# Patient Record
Sex: Male | Born: 1965
Health system: Southern US, Community
[De-identification: ages and names within clinical notes are randomized; demographics above are authoritative.]

## PROBLEM LIST (undated history)

## (undated) DIAGNOSIS — R252 Cramp and spasm: Secondary | ICD-10-CM

## (undated) DIAGNOSIS — E039 Hypothyroidism, unspecified: Secondary | ICD-10-CM

## (undated) DIAGNOSIS — M9943 Connective tissue stenosis of neural canal of lumbar region: Secondary | ICD-10-CM

## (undated) DIAGNOSIS — K219 Gastro-esophageal reflux disease without esophagitis: Secondary | ICD-10-CM

## (undated) DIAGNOSIS — E611 Iron deficiency: Secondary | ICD-10-CM

## (undated) DIAGNOSIS — F909 Attention-deficit hyperactivity disorder, unspecified type: Secondary | ICD-10-CM

## (undated) DIAGNOSIS — G2581 Restless legs syndrome: Secondary | ICD-10-CM

## (undated) HISTORY — DX: Gastro-esophageal reflux disease without esophagitis: K21.9

## (undated) HISTORY — DX: Hypothyroidism, unspecified: E03.9

## (undated) HISTORY — DX: Connective tissue stenosis of neural canal of lumbar region: M99.43

---

## 2006-05-22 ENCOUNTER — Emergency Department: Payer: Self-pay | Admitting: Emergency Medicine

## 2007-01-19 ENCOUNTER — Emergency Department (HOSPITAL_COMMUNITY): Admission: EM | Admit: 2007-01-19 | Discharge: 2007-01-19 | Payer: Self-pay | Admitting: Emergency Medicine

## 2008-03-17 ENCOUNTER — Ambulatory Visit: Payer: Self-pay | Admitting: Family Medicine

## 2008-03-18 ENCOUNTER — Ambulatory Visit: Payer: Self-pay | Admitting: Family Medicine

## 2008-04-14 ENCOUNTER — Ambulatory Visit: Payer: Self-pay | Admitting: Family Medicine

## 2013-10-24 ENCOUNTER — Other Ambulatory Visit: Payer: Self-pay | Admitting: Sports Medicine

## 2013-10-24 DIAGNOSIS — M79669 Pain in unspecified lower leg: Secondary | ICD-10-CM

## 2013-10-29 ENCOUNTER — Ambulatory Visit
Admission: RE | Admit: 2013-10-29 | Discharge: 2013-10-29 | Disposition: A | Discharge status: Home / Self Care | Payer: BC Managed Care – PPO | Source: Ambulatory Visit | Attending: Sports Medicine | Admitting: Sports Medicine

## 2013-10-29 DIAGNOSIS — M79669 Pain in unspecified lower leg: Secondary | ICD-10-CM

## 2013-10-29 MED ORDER — IOHEXOL 180 MG/ML  SOLN
1.0000 mL | Freq: Once | INTRAMUSCULAR | Status: AC | PRN
  Administered 2013-10-29: 1 mL via EPIDURAL

## 2013-10-29 MED ORDER — METHYLPREDNISOLONE ACETATE 40 MG/ML INJ SUSP (RADIOLOG
120.0000 mg | Freq: Once | INTRAMUSCULAR | Status: AC
  Administered 2013-10-29: 120 mg via EPIDURAL

## 2013-10-29 NOTE — Discharge Instructions (Signed)

## 2013-11-22 ENCOUNTER — Other Ambulatory Visit: Payer: Self-pay | Admitting: Sports Medicine

## 2013-11-22 DIAGNOSIS — M79669 Pain in unspecified lower leg: Secondary | ICD-10-CM

## 2013-11-29 ENCOUNTER — Ambulatory Visit
Admission: RE | Admit: 2013-11-29 | Discharge: 2013-11-29 | Disposition: A | Discharge status: Home / Self Care | Payer: BC Managed Care – PPO | Source: Ambulatory Visit | Attending: Sports Medicine | Admitting: Sports Medicine

## 2013-11-29 VITALS — BP 121/81 | HR 62

## 2013-11-29 DIAGNOSIS — M79669 Pain in unspecified lower leg: Secondary | ICD-10-CM

## 2013-11-29 MED ORDER — IOHEXOL 180 MG/ML  SOLN
1.0000 mL | Freq: Once | INTRAMUSCULAR | Status: AC | PRN
  Administered 2013-11-29: 1 mL via EPIDURAL

## 2013-11-29 MED ORDER — METHYLPREDNISOLONE ACETATE 40 MG/ML INJ SUSP (RADIOLOG
120.0000 mg | Freq: Once | INTRAMUSCULAR | Status: AC
  Administered 2013-11-29: 120 mg via EPIDURAL

## 2014-03-17 DIAGNOSIS — M9943 Connective tissue stenosis of neural canal of lumbar region: Secondary | ICD-10-CM

## 2014-03-17 HISTORY — DX: Connective tissue stenosis of neural canal of lumbar region: M99.43

## 2015-06-23 DIAGNOSIS — F4323 Adjustment disorder with mixed anxiety and depressed mood: Secondary | ICD-10-CM | POA: Diagnosis not present

## 2015-06-30 DIAGNOSIS — F4323 Adjustment disorder with mixed anxiety and depressed mood: Secondary | ICD-10-CM | POA: Diagnosis not present

## 2015-07-07 DIAGNOSIS — F4323 Adjustment disorder with mixed anxiety and depressed mood: Secondary | ICD-10-CM | POA: Diagnosis not present

## 2015-07-27 DIAGNOSIS — F419 Anxiety disorder, unspecified: Secondary | ICD-10-CM | POA: Diagnosis not present

## 2015-08-07 DIAGNOSIS — F419 Anxiety disorder, unspecified: Secondary | ICD-10-CM | POA: Diagnosis not present

## 2015-08-12 DIAGNOSIS — F419 Anxiety disorder, unspecified: Secondary | ICD-10-CM | POA: Diagnosis not present

## 2015-08-20 DIAGNOSIS — F419 Anxiety disorder, unspecified: Secondary | ICD-10-CM | POA: Diagnosis not present

## 2015-08-21 DIAGNOSIS — M9902 Segmental and somatic dysfunction of thoracic region: Secondary | ICD-10-CM | POA: Diagnosis not present

## 2015-08-21 DIAGNOSIS — M9903 Segmental and somatic dysfunction of lumbar region: Secondary | ICD-10-CM | POA: Diagnosis not present

## 2015-08-21 DIAGNOSIS — M9904 Segmental and somatic dysfunction of sacral region: Secondary | ICD-10-CM | POA: Diagnosis not present

## 2015-08-21 DIAGNOSIS — M9905 Segmental and somatic dysfunction of pelvic region: Secondary | ICD-10-CM | POA: Diagnosis not present

## 2015-08-24 DIAGNOSIS — M9905 Segmental and somatic dysfunction of pelvic region: Secondary | ICD-10-CM | POA: Diagnosis not present

## 2015-08-24 DIAGNOSIS — M9902 Segmental and somatic dysfunction of thoracic region: Secondary | ICD-10-CM | POA: Diagnosis not present

## 2015-08-24 DIAGNOSIS — M9904 Segmental and somatic dysfunction of sacral region: Secondary | ICD-10-CM | POA: Diagnosis not present

## 2015-08-24 DIAGNOSIS — M9903 Segmental and somatic dysfunction of lumbar region: Secondary | ICD-10-CM | POA: Diagnosis not present

## 2015-08-31 DIAGNOSIS — M9902 Segmental and somatic dysfunction of thoracic region: Secondary | ICD-10-CM | POA: Diagnosis not present

## 2015-08-31 DIAGNOSIS — F419 Anxiety disorder, unspecified: Secondary | ICD-10-CM | POA: Diagnosis not present

## 2015-08-31 DIAGNOSIS — M9904 Segmental and somatic dysfunction of sacral region: Secondary | ICD-10-CM | POA: Diagnosis not present

## 2015-08-31 DIAGNOSIS — M9903 Segmental and somatic dysfunction of lumbar region: Secondary | ICD-10-CM | POA: Diagnosis not present

## 2015-08-31 DIAGNOSIS — M9905 Segmental and somatic dysfunction of pelvic region: Secondary | ICD-10-CM | POA: Diagnosis not present

## 2015-09-03 DIAGNOSIS — M9904 Segmental and somatic dysfunction of sacral region: Secondary | ICD-10-CM | POA: Diagnosis not present

## 2015-09-03 DIAGNOSIS — M9905 Segmental and somatic dysfunction of pelvic region: Secondary | ICD-10-CM | POA: Diagnosis not present

## 2015-09-03 DIAGNOSIS — M9903 Segmental and somatic dysfunction of lumbar region: Secondary | ICD-10-CM | POA: Diagnosis not present

## 2015-09-03 DIAGNOSIS — M9902 Segmental and somatic dysfunction of thoracic region: Secondary | ICD-10-CM | POA: Diagnosis not present

## 2015-09-08 DIAGNOSIS — M9903 Segmental and somatic dysfunction of lumbar region: Secondary | ICD-10-CM | POA: Diagnosis not present

## 2015-09-08 DIAGNOSIS — M9902 Segmental and somatic dysfunction of thoracic region: Secondary | ICD-10-CM | POA: Diagnosis not present

## 2015-09-08 DIAGNOSIS — M9905 Segmental and somatic dysfunction of pelvic region: Secondary | ICD-10-CM | POA: Diagnosis not present

## 2015-09-08 DIAGNOSIS — M9904 Segmental and somatic dysfunction of sacral region: Secondary | ICD-10-CM | POA: Diagnosis not present

## 2015-09-10 DIAGNOSIS — F419 Anxiety disorder, unspecified: Secondary | ICD-10-CM | POA: Diagnosis not present

## 2015-09-11 DIAGNOSIS — M9905 Segmental and somatic dysfunction of pelvic region: Secondary | ICD-10-CM | POA: Diagnosis not present

## 2015-09-11 DIAGNOSIS — M9902 Segmental and somatic dysfunction of thoracic region: Secondary | ICD-10-CM | POA: Diagnosis not present

## 2015-09-11 DIAGNOSIS — M9903 Segmental and somatic dysfunction of lumbar region: Secondary | ICD-10-CM | POA: Diagnosis not present

## 2015-09-11 DIAGNOSIS — M9904 Segmental and somatic dysfunction of sacral region: Secondary | ICD-10-CM | POA: Diagnosis not present

## 2015-09-14 DIAGNOSIS — M9905 Segmental and somatic dysfunction of pelvic region: Secondary | ICD-10-CM | POA: Diagnosis not present

## 2015-09-14 DIAGNOSIS — M9904 Segmental and somatic dysfunction of sacral region: Secondary | ICD-10-CM | POA: Diagnosis not present

## 2015-09-14 DIAGNOSIS — M9903 Segmental and somatic dysfunction of lumbar region: Secondary | ICD-10-CM | POA: Diagnosis not present

## 2015-09-14 DIAGNOSIS — M9902 Segmental and somatic dysfunction of thoracic region: Secondary | ICD-10-CM | POA: Diagnosis not present

## 2015-09-17 DIAGNOSIS — M9905 Segmental and somatic dysfunction of pelvic region: Secondary | ICD-10-CM | POA: Diagnosis not present

## 2015-09-17 DIAGNOSIS — M9903 Segmental and somatic dysfunction of lumbar region: Secondary | ICD-10-CM | POA: Diagnosis not present

## 2015-09-17 DIAGNOSIS — M9902 Segmental and somatic dysfunction of thoracic region: Secondary | ICD-10-CM | POA: Diagnosis not present

## 2015-09-17 DIAGNOSIS — M9904 Segmental and somatic dysfunction of sacral region: Secondary | ICD-10-CM | POA: Diagnosis not present

## 2015-09-23 DIAGNOSIS — F419 Anxiety disorder, unspecified: Secondary | ICD-10-CM | POA: Diagnosis not present

## 2015-09-25 DIAGNOSIS — M9905 Segmental and somatic dysfunction of pelvic region: Secondary | ICD-10-CM | POA: Diagnosis not present

## 2015-09-25 DIAGNOSIS — M9904 Segmental and somatic dysfunction of sacral region: Secondary | ICD-10-CM | POA: Diagnosis not present

## 2015-09-25 DIAGNOSIS — M9903 Segmental and somatic dysfunction of lumbar region: Secondary | ICD-10-CM | POA: Diagnosis not present

## 2015-09-25 DIAGNOSIS — M9902 Segmental and somatic dysfunction of thoracic region: Secondary | ICD-10-CM | POA: Diagnosis not present

## 2015-09-28 DIAGNOSIS — M9904 Segmental and somatic dysfunction of sacral region: Secondary | ICD-10-CM | POA: Diagnosis not present

## 2015-09-28 DIAGNOSIS — M9902 Segmental and somatic dysfunction of thoracic region: Secondary | ICD-10-CM | POA: Diagnosis not present

## 2015-09-28 DIAGNOSIS — M9905 Segmental and somatic dysfunction of pelvic region: Secondary | ICD-10-CM | POA: Diagnosis not present

## 2015-09-28 DIAGNOSIS — M9903 Segmental and somatic dysfunction of lumbar region: Secondary | ICD-10-CM | POA: Diagnosis not present

## 2015-10-01 DIAGNOSIS — F419 Anxiety disorder, unspecified: Secondary | ICD-10-CM | POA: Diagnosis not present

## 2015-10-01 DIAGNOSIS — M9905 Segmental and somatic dysfunction of pelvic region: Secondary | ICD-10-CM | POA: Diagnosis not present

## 2015-10-01 DIAGNOSIS — M9903 Segmental and somatic dysfunction of lumbar region: Secondary | ICD-10-CM | POA: Diagnosis not present

## 2015-10-01 DIAGNOSIS — M9902 Segmental and somatic dysfunction of thoracic region: Secondary | ICD-10-CM | POA: Diagnosis not present

## 2015-10-01 DIAGNOSIS — M9904 Segmental and somatic dysfunction of sacral region: Secondary | ICD-10-CM | POA: Diagnosis not present

## 2015-10-22 DIAGNOSIS — M9905 Segmental and somatic dysfunction of pelvic region: Secondary | ICD-10-CM | POA: Diagnosis not present

## 2015-10-22 DIAGNOSIS — M9902 Segmental and somatic dysfunction of thoracic region: Secondary | ICD-10-CM | POA: Diagnosis not present

## 2015-10-22 DIAGNOSIS — F419 Anxiety disorder, unspecified: Secondary | ICD-10-CM | POA: Diagnosis not present

## 2015-10-22 DIAGNOSIS — M9904 Segmental and somatic dysfunction of sacral region: Secondary | ICD-10-CM | POA: Diagnosis not present

## 2015-10-22 DIAGNOSIS — M9903 Segmental and somatic dysfunction of lumbar region: Secondary | ICD-10-CM | POA: Diagnosis not present

## 2015-10-26 DIAGNOSIS — M9902 Segmental and somatic dysfunction of thoracic region: Secondary | ICD-10-CM | POA: Diagnosis not present

## 2015-10-26 DIAGNOSIS — M9904 Segmental and somatic dysfunction of sacral region: Secondary | ICD-10-CM | POA: Diagnosis not present

## 2015-10-26 DIAGNOSIS — M9903 Segmental and somatic dysfunction of lumbar region: Secondary | ICD-10-CM | POA: Diagnosis not present

## 2015-10-26 DIAGNOSIS — M9905 Segmental and somatic dysfunction of pelvic region: Secondary | ICD-10-CM | POA: Diagnosis not present

## 2015-10-28 DIAGNOSIS — G4733 Obstructive sleep apnea (adult) (pediatric): Secondary | ICD-10-CM | POA: Diagnosis not present

## 2015-10-30 DIAGNOSIS — M9902 Segmental and somatic dysfunction of thoracic region: Secondary | ICD-10-CM | POA: Diagnosis not present

## 2015-10-30 DIAGNOSIS — M9903 Segmental and somatic dysfunction of lumbar region: Secondary | ICD-10-CM | POA: Diagnosis not present

## 2015-10-30 DIAGNOSIS — M9905 Segmental and somatic dysfunction of pelvic region: Secondary | ICD-10-CM | POA: Diagnosis not present

## 2015-10-30 DIAGNOSIS — M9904 Segmental and somatic dysfunction of sacral region: Secondary | ICD-10-CM | POA: Diagnosis not present

## 2015-11-03 DIAGNOSIS — M9902 Segmental and somatic dysfunction of thoracic region: Secondary | ICD-10-CM | POA: Diagnosis not present

## 2015-11-03 DIAGNOSIS — M9905 Segmental and somatic dysfunction of pelvic region: Secondary | ICD-10-CM | POA: Diagnosis not present

## 2015-11-03 DIAGNOSIS — M9903 Segmental and somatic dysfunction of lumbar region: Secondary | ICD-10-CM | POA: Diagnosis not present

## 2015-11-03 DIAGNOSIS — M9904 Segmental and somatic dysfunction of sacral region: Secondary | ICD-10-CM | POA: Diagnosis not present

## 2015-11-06 DIAGNOSIS — M9902 Segmental and somatic dysfunction of thoracic region: Secondary | ICD-10-CM | POA: Diagnosis not present

## 2015-11-06 DIAGNOSIS — M9903 Segmental and somatic dysfunction of lumbar region: Secondary | ICD-10-CM | POA: Diagnosis not present

## 2015-11-06 DIAGNOSIS — M9904 Segmental and somatic dysfunction of sacral region: Secondary | ICD-10-CM | POA: Diagnosis not present

## 2015-11-06 DIAGNOSIS — M9905 Segmental and somatic dysfunction of pelvic region: Secondary | ICD-10-CM | POA: Diagnosis not present

## 2015-11-10 DIAGNOSIS — L723 Sebaceous cyst: Secondary | ICD-10-CM | POA: Diagnosis not present

## 2015-11-11 DIAGNOSIS — M9905 Segmental and somatic dysfunction of pelvic region: Secondary | ICD-10-CM | POA: Diagnosis not present

## 2015-11-11 DIAGNOSIS — M9904 Segmental and somatic dysfunction of sacral region: Secondary | ICD-10-CM | POA: Diagnosis not present

## 2015-11-11 DIAGNOSIS — M9902 Segmental and somatic dysfunction of thoracic region: Secondary | ICD-10-CM | POA: Diagnosis not present

## 2015-11-11 DIAGNOSIS — M9903 Segmental and somatic dysfunction of lumbar region: Secondary | ICD-10-CM | POA: Diagnosis not present

## 2015-11-18 DIAGNOSIS — M9902 Segmental and somatic dysfunction of thoracic region: Secondary | ICD-10-CM | POA: Diagnosis not present

## 2015-11-18 DIAGNOSIS — M9905 Segmental and somatic dysfunction of pelvic region: Secondary | ICD-10-CM | POA: Diagnosis not present

## 2015-11-18 DIAGNOSIS — M9904 Segmental and somatic dysfunction of sacral region: Secondary | ICD-10-CM | POA: Diagnosis not present

## 2015-11-18 DIAGNOSIS — M9903 Segmental and somatic dysfunction of lumbar region: Secondary | ICD-10-CM | POA: Diagnosis not present

## 2015-11-25 DIAGNOSIS — M9903 Segmental and somatic dysfunction of lumbar region: Secondary | ICD-10-CM | POA: Diagnosis not present

## 2015-11-25 DIAGNOSIS — M9904 Segmental and somatic dysfunction of sacral region: Secondary | ICD-10-CM | POA: Diagnosis not present

## 2015-11-25 DIAGNOSIS — M9905 Segmental and somatic dysfunction of pelvic region: Secondary | ICD-10-CM | POA: Diagnosis not present

## 2015-11-25 DIAGNOSIS — M9902 Segmental and somatic dysfunction of thoracic region: Secondary | ICD-10-CM | POA: Diagnosis not present

## 2015-11-30 DIAGNOSIS — M9903 Segmental and somatic dysfunction of lumbar region: Secondary | ICD-10-CM | POA: Diagnosis not present

## 2015-11-30 DIAGNOSIS — M955 Acquired deformity of pelvis: Secondary | ICD-10-CM | POA: Diagnosis not present

## 2015-11-30 DIAGNOSIS — M9905 Segmental and somatic dysfunction of pelvic region: Secondary | ICD-10-CM | POA: Diagnosis not present

## 2015-11-30 DIAGNOSIS — M5416 Radiculopathy, lumbar region: Secondary | ICD-10-CM | POA: Diagnosis not present

## 2016-01-27 DIAGNOSIS — K219 Gastro-esophageal reflux disease without esophagitis: Secondary | ICD-10-CM

## 2016-01-27 DIAGNOSIS — R5383 Other fatigue: Secondary | ICD-10-CM | POA: Diagnosis not present

## 2016-01-27 HISTORY — DX: Gastro-esophageal reflux disease without esophagitis: K21.9

## 2016-03-04 ENCOUNTER — Encounter: Payer: Self-pay | Admitting: Family Medicine

## 2016-03-04 ENCOUNTER — Ambulatory Visit (INDEPENDENT_AMBULATORY_CARE_PROVIDER_SITE_OTHER): Payer: BLUE CROSS/BLUE SHIELD | Admitting: Family Medicine

## 2016-03-04 VITALS — BP 139/82 | HR 64 | Ht 71.0 in | Wt 246.0 lb

## 2016-03-04 DIAGNOSIS — E039 Hypothyroidism, unspecified: Secondary | ICD-10-CM | POA: Diagnosis not present

## 2016-03-04 DIAGNOSIS — M9943 Connective tissue stenosis of neural canal of lumbar region: Secondary | ICD-10-CM | POA: Diagnosis not present

## 2016-03-04 DIAGNOSIS — K219 Gastro-esophageal reflux disease without esophagitis: Secondary | ICD-10-CM

## 2016-03-04 DIAGNOSIS — R252 Cramp and spasm: Secondary | ICD-10-CM

## 2016-03-04 HISTORY — DX: Hypothyroidism, unspecified: E03.9

## 2016-03-04 MED ORDER — BACLOFEN 10 MG PO TABS: 5.0000 mg | ORAL_TABLET | Freq: Every evening | ORAL | 2 refills | Status: DC | PRN

## 2016-03-04 NOTE — Patient Instructions (Signed)
Thank you for coming in today. Add baclofen.  Do the Eccentric calf exercises daily.  Let me know how you are doing.    Muscle Cramps and Spasms Muscle cramps and spasms occur when a muscle or muscles tighten and you have no control over this tightening (involuntary muscle contraction). They are a common problem and can develop in any muscle. The most common place is in the calf muscles of the leg. Both muscle cramps and muscle spasms are involuntary muscle contractions, but they also have differences:   Muscle cramps are sporadic and painful. They may last a few seconds to a quarter of an hour. Muscle cramps are often more forceful and last longer than muscle spasms.  Muscle spasms may or may not be painful. They may also last just a few seconds or much longer. CAUSES  It is uncommon for cramps or spasms to be due to a serious underlying problem. In many cases, the cause of cramps or spasms is unknown. Some common causes are:   Overexertion.   Overuse from repetitive motions (doing the same thing over and over).   Remaining in a certain position for a long period of time.   Improper preparation, form, or technique while performing a sport or activity.   Dehydration.   Injury.   Side effects of some medicines.   Abnormally low levels of the salts and ions in your blood (electrolytes), especially potassium and calcium. This could happen if you are taking water pills (diuretics) or you are pregnant.  Some underlying medical problems can make it more likely to develop cramps or spasms. These include, but are not limited to:   Diabetes.   Parkinson disease.   Hormone disorders, such as thyroid problems.   Alcohol abuse.   Diseases specific to muscles, joints, and bones.   Blood vessel disease where not enough blood is getting to the muscles.  HOME CARE INSTRUCTIONS   Stay well hydrated. Drink enough water and fluids to keep your urine clear or pale yellow.  It  may be helpful to massage, stretch, and relax the affected muscle.  For tight or tense muscles, use a warm towel, heating pad, or hot shower water directed to the affected area.  If you are sore or have pain after a cramp or spasm, applying ice to the affected area may relieve discomfort.  Put ice in a plastic bag.  Place a towel between your skin and the bag.  Leave the ice on for 15-20 minutes, 3-4 times a day.  Medicines used to treat a known cause of cramps or spasms may help reduce their frequency or severity. Only take over-the-counter or prescription medicines as directed by your caregiver. SEEK MEDICAL CARE IF:  Your cramps or spasms get more severe, more frequent, or do not improve over time.  MAKE SURE YOU:   Understand these instructions.  Will watch your condition.  Will get help right away if you are not doing well or get worse. This information is not intended to replace advice given to you by your health care provider. Make sure you discuss any questions you have with your health care provider. Document Released: 08/27/2001 Document Revised: 07/02/2012 Document Reviewed: 12/09/2014 Elsevier Interactive Patient Education  2017 Elsevier Inc.   Baclofen tablets What is this medicine? BACLOFEN (BAK loe fen) helps relieve spasms and cramping of muscles. It may be used to treat symptoms of multiple sclerosis or spinal cord injury. This medicine may be used for other purposes; ask  your health care provider or pharmacist if you have questions. COMMON BRAND NAME(S): ED Baclofen, Lioresal What should I tell my health care provider before I take this medicine? They need to know if you have any of these conditions: -kidney disease -seizures -stroke -an unusual or allergic reaction to baclofen, other medicines, foods, dyes, or preservatives -pregnant or trying to get pregnant -breast-feeding How should I use this medicine? Take this medicine by mouth. Swallow it with a  drink of water. Follow the directions on the prescription label. Do not take more medicine than you are told to take. Talk to your pediatrician regarding the use of this medicine in children. Special care may be needed. Overdosage: If you think you have taken too much of this medicine contact a poison control center or emergency room at once. NOTE: This medicine is only for you. Do not share this medicine with others. What if I miss a dose? If you miss a dose, take it as soon as you can. If it is almost time for your next dose, take only that dose. Do not take double or extra doses. What may interact with this medicine? Do not take this medication with any of the following medicines: -narcotic medicines for cough This medicine may also interact with the following medications: -alcohol -antihistamines for allergy, cough and cold -certain medicines for anxiety or sleep -certain medicines for depression like amitriptyline, fluoxetine, sertraline -certain medicines for seizures like phenobarbital, primidone -general anesthetics like halothane, isoflurane, methoxyflurane, propofol -local anesthetics like lidocaine, pramoxine, tetracaine -medicines that relax muscles for surgery -narcotic medicines for pain -phenothiazines like chlorpromazine, mesoridazine, prochlorperazine, thioridazine This list may not describe all possible interactions. Give your health care provider a list of all the medicines, herbs, non-prescription drugs, or dietary supplements you use. Also tell them if you smoke, drink alcohol, or use illegal drugs. Some items may interact with your medicine. What should I watch for while using this medicine? Tell your doctor or health care professional if your symptoms do not start to get better or if they get worse. Do not suddenly stop taking your medicine. If you do, you may develop a severe reaction. If your doctor wants you to stop the medicine, the dose will be slowly lowered over  time to avoid any side effects. Follow the advice of your doctor. You may get drowsy or dizzy. Do not drive, use machinery, or do anything that needs mental alertness until you know how this medicine affects you. Do not stand or sit up quickly, especially if you are an older patient. This reduces the risk of dizzy or fainting spells. Alcohol may interfere with the effect of this medicine. Avoid alcoholic drinks. If you are taking another medicine that also causes drowsiness, you may have more side effects. Give your health care provider a list of all medicines you use. Your doctor will tell you how much medicine to take. Do not take more medicine than directed. Call emergency for help if you have problems breathing or unusual sleepiness. What side effects may I notice from receiving this medicine? Side effects that you should report to your doctor or health care professional as soon as possible: -allergic reactions like skin rash, itching or hives, swelling of the face, lips, or tongue -breathing problems -changes in emotions or moods -changes in vision -chest pain -fast, irregular heartbeat -feeling faint or lightheaded, falls -hallucinations -loss of balance or coordination -ringing of the ears -seizures -trouble passing urine or change in the amount of  urine -trouble walking -unusually weak or tired Side effects that usually do not require medical attention (report to your doctor or health care professional if they continue or are bothersome): -changes in taste -confusion -constipation -diarrhea -dry mouth -headache -muscle weakness -nausea, vomiting -trouble sleeping This list may not describe all possible side effects. Call your doctor for medical advice about side effects. You may report side effects to FDA at 1-800-FDA-1088. Where should I keep my medicine? Keep out of the reach of children. Store at room temperature between 15 and 30 degrees C (59 and 86 degrees F). Keep  container tightly closed. Throw away any unused medicine after the expiration date. NOTE: This sheet is a summary. It may not cover all possible information. If you have questions about this medicine, talk to your doctor, pharmacist, or health care provider.  2017 Elsevier/Gold Standard (2014-12-15 15:56:23)

## 2016-03-04 NOTE — Progress Notes (Signed)
Jay BradleyStephen Schneider Jay MccallumBloch Schulman is a 50 y.o. male who presents to [facility] today for discuss calf cramping.  Patient has a several year history of bilateral calf cramping. The cramping typically occurs at night a day or 2 following exertion. It is very painful and awakens him from sleep. He notes the Pain is limiting his ability to exercise. He denies any pain or limitations during the exercise and self but has bothersome cramping a day or 2 later.  He has had a limited workup including a lumbar MRI showing lumbar spinal stenosis worst at L4-L5. He's had 2 epidural steroid injections in 2015 and it made no difference. He denies any radiating pain weakness or numbness into his legs. She only about a month and a half ago he was diagnosed with mild hypothyroidism with a TSH of 5.8. He's been taking levothyroxine 50 g daily now for about a month and a half. He's not sure if there is been much difference with the new levothyroxine. He denies any bowel or bladder dysfunction fevers or chills nausea vomiting or diarrhea.   Past Medical History:  Diagnosis Date  . Connective tissue stenosis of neural canal of lumbar region 03/17/2014  . Gastroesophageal reflux disease without esophagitis 01/27/2016  . Hypothyroidism 03/04/2016   No past surgical history on file. Social History  Substance Use Topics  . Smoking status: Not on file  . Smokeless tobacco: Not on file  . Alcohol use Not on file   family history is not on file.  ROS:  No headache, visual changes, nausea, vomiting, diarrhea, constipation, dizziness, abdominal pain, skin rash, fevers, chills, night sweats, weight loss, swollen lymph nodes, body aches, joint swelling, muscle aches, chest pain, shortness of breath, mood changes, visual or auditory hallucinations.    Medications: Current Outpatient Prescriptions  Medication Sig Dispense Refill  . levothyroxine (SYNTHROID, LEVOTHROID) 50 MCG  tablet Take by mouth.    . baclofen (LIORESAL) 10 MG tablet Take 0.5-1 tablets (5-10 mg total) by mouth at bedtime as needed for muscle spasms. 30 each 2   No current facility-administered medications for this visit.    No Known Allergies   Exam:  BP 139/82   Pulse 64   Ht 5\' 11"  (1.803 m)   Wt 246 lb (111.6 kg)   BMI 34.31 kg/m  General: Well Developed, well nourished, and in no acute distress.  Neuro/Psych: Alert and oriented x3, extra-ocular muscles intact, able to move all 4 extremities, sensation grossly intact. Skin: Warm and dry, no rashes noted.  Respiratory: Not using accessory muscles, speaking in full sentences, trachea midline.  Cardiovascular: Pulses palpable, no extremity edema. Abdomen: Does not appear distended. MSK:  Lumbar spine is normal appearing and nontender. He has limited lumbar flexion but intact extension rotation range of motion.  Lower extremity sensation is equal and normal throughout.  Calves are normal-appearing bilaterally and are nontender. He has normal calf motion. He has intact pulses capillary refill and sensation to his feet bilaterally.  Left foot has a moderate bunion with mild ankle pronation but normal and symmetrical heel valgus with toe standing.  Review of laboratories workup from [facility] [place] November 2017 showed normal metabolic panel CBC CK and sedimentation rate. TSH is mildly elevated as noted above.    No results found for this or any previous visit (from the past 48 hour(s)). No results found.    Assessment and Plan: 50 y.o. male with  Calf cramping. This appears to  be idiopathic at this time. There are several possibilities. He may have easy susceptibility to cramping with overexertion with deconditioning probable mild hypothyroidism and possible spinal stenosis. I think the spinal stenosis is less likely of a cause.   We discussed several options. Plan for trial of baclofen as needed at night while continuing to  titrate levothyroxine dose as guided by his primary care provider. Additionally will work on calf eccentric exercises to help strengthen muscles and increase flexibility. This is been shown to reduce delayed onset muscle soreness with exercise which may be a component of his cramping. Additionally recommend a post exercise nutrition regimen of one cup of chocolate milk as this also has been shown to reduce delayed onset muscle soreness following exercise.   If symptoms persist workup could include a nerve conduction study further rheumatologic workup and further vascular workup. Return as needed.     No orders of the defined types were placed in this encounter.   Discussed warning signs or symptoms. Please see discharge instructions. Patient expresses understanding.  CC:  Dionisio PaschalBronstein, David Marvin, MD  [phone]  [place], [place] [id number]  [phone]  [phone] (Fax)

## 2016-03-28 DIAGNOSIS — R946 Abnormal results of thyroid function studies: Secondary | ICD-10-CM | POA: Diagnosis not present

## 2016-04-01 DIAGNOSIS — E039 Hypothyroidism, unspecified: Secondary | ICD-10-CM | POA: Diagnosis not present

## 2016-05-01 DIAGNOSIS — J018 Other acute sinusitis: Secondary | ICD-10-CM | POA: Diagnosis not present

## 2016-05-25 DIAGNOSIS — E039 Hypothyroidism, unspecified: Secondary | ICD-10-CM | POA: Diagnosis not present

## 2016-07-25 ENCOUNTER — Other Ambulatory Visit: Payer: Self-pay | Admitting: Family Medicine

## 2016-08-02 ENCOUNTER — Telehealth: Payer: Self-pay | Admitting: Family Medicine

## 2016-08-02 MED ORDER — BACLOFEN 10 MG PO TABS: 10.0000 mg | ORAL_TABLET | Freq: Every day | ORAL | 2 refills | Status: DC

## 2016-08-02 NOTE — Telephone Encounter (Signed)
Discussed having some spasm at night after a hard workout.  Plan to try 20mg  as needed.

## 2016-08-08 DIAGNOSIS — Z23 Encounter for immunization: Secondary | ICD-10-CM | POA: Diagnosis not present

## 2016-08-30 DIAGNOSIS — F419 Anxiety disorder, unspecified: Secondary | ICD-10-CM | POA: Diagnosis not present

## 2016-09-09 DIAGNOSIS — F419 Anxiety disorder, unspecified: Secondary | ICD-10-CM | POA: Diagnosis not present

## 2016-09-09 DIAGNOSIS — Z630 Problems in relationship with spouse or partner: Secondary | ICD-10-CM | POA: Diagnosis not present

## 2016-09-16 DIAGNOSIS — F419 Anxiety disorder, unspecified: Secondary | ICD-10-CM | POA: Diagnosis not present

## 2016-09-16 DIAGNOSIS — Z630 Problems in relationship with spouse or partner: Secondary | ICD-10-CM | POA: Diagnosis not present

## 2016-09-19 ENCOUNTER — Telehealth: Payer: Self-pay | Admitting: Family Medicine

## 2016-09-19 NOTE — Telephone Encounter (Signed)
Patient called adv that he spoke with you outside of the office and you were going to give him a C-pap Titration test pt request to know if you can order one please adv [phone]

## 2016-09-20 MED ORDER — AMBULATORY NON FORMULARY MEDICATION: 0 refills | Status: DC

## 2016-09-20 NOTE — Telephone Encounter (Signed)
Order written. We will need to contact pt's CPAP supplier and arrange for this.

## 2016-09-20 NOTE — Telephone Encounter (Signed)
Faxed

## 2016-09-28 DIAGNOSIS — F419 Anxiety disorder, unspecified: Secondary | ICD-10-CM | POA: Diagnosis not present

## 2016-09-28 DIAGNOSIS — Z630 Problems in relationship with spouse or partner: Secondary | ICD-10-CM | POA: Diagnosis not present

## 2016-09-30 ENCOUNTER — Telehealth: Payer: Self-pay | Admitting: Family Medicine

## 2016-09-30 MED ORDER — AMBULATORY NON FORMULARY MEDICATION: 0 refills | Status: DC

## 2016-09-30 NOTE — Telephone Encounter (Signed)
1 

## 2016-09-30 NOTE — Telephone Encounter (Signed)
CPAP/OSA:  Jay Schneider benefits from his CPAP and uses it daily. However is noted previously he is having more fatigue and thinks perhaps the current settings are an appropriate. I have performed a physical exam at his house and notes that he has increased neck mass and a normal-appearing posterior pharynx. His heart and lung sounds are normal. He has a normal heart rate.  Plan to get a new CPAP machine that'll allow auto titration trial.

## 2016-10-07 DIAGNOSIS — Z630 Problems in relationship with spouse or partner: Secondary | ICD-10-CM | POA: Diagnosis not present

## 2016-10-07 DIAGNOSIS — F419 Anxiety disorder, unspecified: Secondary | ICD-10-CM | POA: Diagnosis not present

## 2016-10-11 DIAGNOSIS — G4733 Obstructive sleep apnea (adult) (pediatric): Secondary | ICD-10-CM | POA: Diagnosis not present

## 2016-10-11 DIAGNOSIS — Z630 Problems in relationship with spouse or partner: Secondary | ICD-10-CM | POA: Diagnosis not present

## 2016-10-11 DIAGNOSIS — F419 Anxiety disorder, unspecified: Secondary | ICD-10-CM | POA: Diagnosis not present

## 2016-10-14 DIAGNOSIS — H66001 Acute suppurative otitis media without spontaneous rupture of ear drum, right ear: Secondary | ICD-10-CM | POA: Diagnosis not present

## 2016-10-14 DIAGNOSIS — H6122 Impacted cerumen, left ear: Secondary | ICD-10-CM | POA: Diagnosis not present

## 2016-10-14 DIAGNOSIS — H6502 Acute serous otitis media, left ear: Secondary | ICD-10-CM | POA: Diagnosis not present

## 2016-10-21 DIAGNOSIS — F419 Anxiety disorder, unspecified: Secondary | ICD-10-CM | POA: Diagnosis not present

## 2016-10-21 DIAGNOSIS — Z630 Problems in relationship with spouse or partner: Secondary | ICD-10-CM | POA: Diagnosis not present

## 2016-10-28 DIAGNOSIS — Z630 Problems in relationship with spouse or partner: Secondary | ICD-10-CM | POA: Diagnosis not present

## 2016-10-28 DIAGNOSIS — F419 Anxiety disorder, unspecified: Secondary | ICD-10-CM | POA: Diagnosis not present

## 2016-11-01 DIAGNOSIS — F418 Other specified anxiety disorders: Secondary | ICD-10-CM | POA: Diagnosis not present

## 2016-11-02 DIAGNOSIS — F419 Anxiety disorder, unspecified: Secondary | ICD-10-CM | POA: Diagnosis not present

## 2016-11-02 DIAGNOSIS — Z630 Problems in relationship with spouse or partner: Secondary | ICD-10-CM | POA: Diagnosis not present

## 2016-11-09 DIAGNOSIS — F418 Other specified anxiety disorders: Secondary | ICD-10-CM | POA: Diagnosis not present

## 2016-11-11 DIAGNOSIS — G4733 Obstructive sleep apnea (adult) (pediatric): Secondary | ICD-10-CM | POA: Diagnosis not present

## 2016-11-11 DIAGNOSIS — Z630 Problems in relationship with spouse or partner: Secondary | ICD-10-CM | POA: Diagnosis not present

## 2016-11-11 DIAGNOSIS — F419 Anxiety disorder, unspecified: Secondary | ICD-10-CM | POA: Diagnosis not present

## 2016-11-15 DIAGNOSIS — F418 Other specified anxiety disorders: Secondary | ICD-10-CM | POA: Diagnosis not present

## 2016-11-18 DIAGNOSIS — F419 Anxiety disorder, unspecified: Secondary | ICD-10-CM | POA: Diagnosis not present

## 2016-11-18 DIAGNOSIS — Z630 Problems in relationship with spouse or partner: Secondary | ICD-10-CM | POA: Diagnosis not present

## 2016-11-25 DIAGNOSIS — Z630 Problems in relationship with spouse or partner: Secondary | ICD-10-CM | POA: Diagnosis not present

## 2016-11-25 DIAGNOSIS — F419 Anxiety disorder, unspecified: Secondary | ICD-10-CM | POA: Diagnosis not present

## 2016-11-28 DIAGNOSIS — F418 Other specified anxiety disorders: Secondary | ICD-10-CM | POA: Diagnosis not present

## 2016-11-29 DIAGNOSIS — Z630 Problems in relationship with spouse or partner: Secondary | ICD-10-CM | POA: Diagnosis not present

## 2016-11-29 DIAGNOSIS — F419 Anxiety disorder, unspecified: Secondary | ICD-10-CM | POA: Diagnosis not present

## 2016-12-01 DIAGNOSIS — L609 Nail disorder, unspecified: Secondary | ICD-10-CM | POA: Diagnosis not present

## 2016-12-01 DIAGNOSIS — B351 Tinea unguium: Secondary | ICD-10-CM | POA: Diagnosis not present

## 2016-12-01 DIAGNOSIS — Z79899 Other long term (current) drug therapy: Secondary | ICD-10-CM | POA: Diagnosis not present

## 2016-12-05 ENCOUNTER — Telehealth: Payer: Self-pay | Admitting: Family Medicine

## 2016-12-05 NOTE — Telephone Encounter (Signed)
Jay Schneider notes pain at the right 5th MCP for about 2 weeks.  He participates in tae kwon do and felt pain after he punched a board. He feels popping and motion at the fifth MCP is seen with pain and swelling. He is concerned he may have fractured it.  Exam: Swollen and mildly tender fifth MCP. No rotational deformity. Motion is intact capillary refill and sensation is intact distally.  Limited musculoskeletal ultrasound shows a defect of the cortex of the bone just proximal to the fifth MCP consistent with fracture.  Patient was placed into an ulnar gutter splint and will return in 2 weeks for x-ray

## 2016-12-09 DIAGNOSIS — Z630 Problems in relationship with spouse or partner: Secondary | ICD-10-CM | POA: Diagnosis not present

## 2016-12-09 DIAGNOSIS — F419 Anxiety disorder, unspecified: Secondary | ICD-10-CM | POA: Diagnosis not present

## 2016-12-12 DIAGNOSIS — G4733 Obstructive sleep apnea (adult) (pediatric): Secondary | ICD-10-CM | POA: Diagnosis not present

## 2016-12-15 DIAGNOSIS — F418 Other specified anxiety disorders: Secondary | ICD-10-CM | POA: Diagnosis not present

## 2016-12-16 ENCOUNTER — Ambulatory Visit (INDEPENDENT_AMBULATORY_CARE_PROVIDER_SITE_OTHER): Payer: BLUE CROSS/BLUE SHIELD | Admitting: Family Medicine

## 2016-12-16 ENCOUNTER — Ambulatory Visit (INDEPENDENT_AMBULATORY_CARE_PROVIDER_SITE_OTHER): Payer: BLUE CROSS/BLUE SHIELD

## 2016-12-16 VITALS — HR 70

## 2016-12-16 DIAGNOSIS — R252 Cramp and spasm: Secondary | ICD-10-CM | POA: Diagnosis not present

## 2016-12-16 DIAGNOSIS — M25541 Pain in joints of right hand: Secondary | ICD-10-CM

## 2016-12-16 DIAGNOSIS — R293 Abnormal posture: Secondary | ICD-10-CM

## 2016-12-16 DIAGNOSIS — M9943 Connective tissue stenosis of neural canal of lumbar region: Secondary | ICD-10-CM | POA: Diagnosis not present

## 2016-12-16 DIAGNOSIS — M659 Synovitis and tenosynovitis, unspecified: Secondary | ICD-10-CM | POA: Diagnosis not present

## 2016-12-16 DIAGNOSIS — S6991XA Unspecified injury of right wrist, hand and finger(s), initial encounter: Secondary | ICD-10-CM | POA: Diagnosis not present

## 2016-12-16 DIAGNOSIS — M79641 Pain in right hand: Secondary | ICD-10-CM | POA: Diagnosis not present

## 2016-12-16 DIAGNOSIS — F419 Anxiety disorder, unspecified: Secondary | ICD-10-CM | POA: Diagnosis not present

## 2016-12-16 DIAGNOSIS — S62339A Displaced fracture of neck of unspecified metacarpal bone, initial encounter for closed fracture: Secondary | ICD-10-CM

## 2016-12-16 DIAGNOSIS — Z630 Problems in relationship with spouse or partner: Secondary | ICD-10-CM | POA: Diagnosis not present

## 2016-12-16 DIAGNOSIS — M65949 Unspecified synovitis and tenosynovitis, unspecified hand: Secondary | ICD-10-CM | POA: Insufficient documentation

## 2016-12-16 MED ORDER — DICLOFENAC SODIUM 1 % TD GEL: 2.0000 g | Freq: Four times a day (QID) | TRANSDERMAL | 11 refills | Status: DC

## 2016-12-16 NOTE — Patient Instructions (Signed)
Thank you for coming in today. We are waiting on xray results from radiology to confirm no fracture.  Apply voltaren (diclofenac) gel 4x daily for pain and swelling.  Continue buddy tape.  We will recheck at home in about 2 weeks or so.  If not getting better hand PT is the next option.   I think the diagnosis is traumatic synovitis.

## 2016-12-16 NOTE — Progress Notes (Signed)
Jay Schneider is a 51 y.o. male who presents to [facility] today for right hand pain. Patient notes pain in the right fifth MCP. He suffered an injury participating in tae kwon do where he punched a board. This occurred about 3-4 weeks ago. He developed pain and swelling in this area and was thought to potentially have a boxer's fracture. He was treated conservatively with an ulnar gutter splint and is here today for follow-up. He notes some continued swelling but overall improved pain.   Additionally patient has poor posture overall. He has some mild back pain as well. He's interested in a physical therapy referral to help deal with poor posture and to improve his back pain.  Past Medical History:  Diagnosis Date  . Connective tissue stenosis of neural canal of lumbar region 03/17/2014  . Gastroesophageal reflux disease without esophagitis 01/27/2016  . Hypothyroidism 03/04/2016   No past surgical history on file. Social History  Substance Use Topics  . Smoking status: Not on file  . Smokeless tobacco: Not on file  . Alcohol use Not on file     ROS:  As above   Medications: Current Outpatient Prescriptions  Medication Sig Dispense Refill  . AMBULATORY NON FORMULARY MEDICATION Continuous positive airway pressure (CPAP) machine auto-titrate from 4-20 cm of H2O pressure, with all supplemental supplies as needed. Please fax 7 day data to [phone] 1 each 0  . baclofen (LIORESAL) 10 MG tablet Take 1-2 tablets (10-20 mg total) by mouth at bedtime. 90 tablet 2  . diclofenac sodium (VOLTAREN) 1 % GEL Apply 2 g topically 4 (four) times daily. To affected joint. 100 g 11  . levothyroxine (SYNTHROID, LEVOTHROID) 50 MCG tablet Take by mouth.     No current facility-administered medications for this visit.    No Known Allergies   Exam:  Pulse 70   General: Well Developed, well nourished, and in no acute distress.    Neuro/Psych: Alert and oriented x3, extra-ocular muscles intact, able to move all 4 extremities, sensation grossly intact. Skin: Warm and dry, no rashes noted.  Respiratory: Not using accessory muscles, speaking in full sentences, trachea midline.  Cardiovascular: Pulses palpable, no extremity edema. Abdomen: Does not appear distended. MSK: Right hand slight swelling and tenderness overlying the fifth MCP. Decreased flexion due to stiffness. Intact extension and flexion strength. Capillary refill and sensation are intact distally. Some mild thoracic kyphosis present. Shoulders are rolled inwards.    No results found for this or any previous visit (from the past 48 hour(s)). Dg Hand Complete Right  Result Date: 12/16/2016 CLINICAL DATA:  Fifth metacarpal pain following karate injury, initial encounter EXAM: RIGHT HAND - COMPLETE 3+ VIEW COMPARISON:  None. FINDINGS: There is no evidence of fracture or dislocation. There is no evidence of arthropathy or other focal bone abnormality. Soft tissues are unremarkable. IMPRESSION: No acute abnormality noted. Electronically Signed   By: Alcide Clever M.D.   On: 12/16/2016 08:29      Assessment and Plan: 51 y.o. male with dramatic synovitis of the right fifth MCP. Doubtful for fracture based on x-ray results. Plan for buddy tape diclofenac gel and relative rest.  Poor posture: Refer to physical therapy. Recheck as needed.   Orders Placed This Encounter  Procedures  . DG Hand Complete Right    Standing Status:   Future    Number of Occurrences:   1    Standing Expiration Date:   02/15/2018  Order Specific Question:   Reason for Exam (SYMPTOM  OR DIAGNOSIS REQUIRED)    Answer:   f/u ?fx    Order Specific Question:   Preferred imaging location?    Answer:   [facility]    Order Specific Question:   Radiology Contrast Protocol - do NOT remove file path    Answer:   [email or web address]  .  Ambulatory referral to Physical Therapy    Referral Priority:   Routine    Referral Type:   Physical Medicine    Referral Reason:   Specialty Services Required    Requested Specialty:   Physical Therapy    Number of Visits Requested:   1   Meds ordered this encounter  Medications  . diclofenac sodium (VOLTAREN) 1 % GEL    Sig: Apply 2 g topically 4 (four) times daily. To affected joint.    Dispense:  100 g    Refill:  11    Discussed warning signs or symptoms. Please see discharge instructions. Patient expresses understanding.

## 2016-12-21 DIAGNOSIS — M546 Pain in thoracic spine: Secondary | ICD-10-CM | POA: Diagnosis not present

## 2016-12-23 DIAGNOSIS — Z630 Problems in relationship with spouse or partner: Secondary | ICD-10-CM | POA: Diagnosis not present

## 2016-12-23 DIAGNOSIS — F419 Anxiety disorder, unspecified: Secondary | ICD-10-CM | POA: Diagnosis not present

## 2016-12-27 ENCOUNTER — Other Ambulatory Visit: Payer: Self-pay | Admitting: Family Medicine

## 2016-12-29 DIAGNOSIS — F418 Other specified anxiety disorders: Secondary | ICD-10-CM | POA: Diagnosis not present

## 2017-01-03 DIAGNOSIS — B351 Tinea unguium: Secondary | ICD-10-CM | POA: Diagnosis not present

## 2017-01-11 DIAGNOSIS — G4733 Obstructive sleep apnea (adult) (pediatric): Secondary | ICD-10-CM | POA: Diagnosis not present

## 2017-01-20 DIAGNOSIS — F419 Anxiety disorder, unspecified: Secondary | ICD-10-CM | POA: Diagnosis not present

## 2017-01-26 DIAGNOSIS — F418 Other specified anxiety disorders: Secondary | ICD-10-CM | POA: Diagnosis not present

## 2017-02-02 DIAGNOSIS — F418 Other specified anxiety disorders: Secondary | ICD-10-CM | POA: Diagnosis not present

## 2017-02-03 DIAGNOSIS — F419 Anxiety disorder, unspecified: Secondary | ICD-10-CM | POA: Diagnosis not present

## 2017-02-08 DIAGNOSIS — F419 Anxiety disorder, unspecified: Secondary | ICD-10-CM | POA: Diagnosis not present

## 2017-02-16 DIAGNOSIS — F419 Anxiety disorder, unspecified: Secondary | ICD-10-CM | POA: Diagnosis not present

## 2017-02-23 DIAGNOSIS — F418 Other specified anxiety disorders: Secondary | ICD-10-CM | POA: Diagnosis not present

## 2017-02-24 DIAGNOSIS — F419 Anxiety disorder, unspecified: Secondary | ICD-10-CM | POA: Diagnosis not present

## 2017-03-02 DIAGNOSIS — F418 Other specified anxiety disorders: Secondary | ICD-10-CM | POA: Diagnosis not present

## 2017-03-09 DIAGNOSIS — F418 Other specified anxiety disorders: Secondary | ICD-10-CM | POA: Diagnosis not present

## 2017-03-16 DIAGNOSIS — F418 Other specified anxiety disorders: Secondary | ICD-10-CM | POA: Diagnosis not present

## 2017-03-24 DIAGNOSIS — F419 Anxiety disorder, unspecified: Secondary | ICD-10-CM | POA: Diagnosis not present

## 2017-03-30 DIAGNOSIS — F418 Other specified anxiety disorders: Secondary | ICD-10-CM | POA: Diagnosis not present

## 2017-03-31 DIAGNOSIS — F419 Anxiety disorder, unspecified: Secondary | ICD-10-CM | POA: Diagnosis not present

## 2017-04-04 ENCOUNTER — Telehealth: Payer: Self-pay | Admitting: Family Medicine

## 2017-04-04 MED ORDER — TYPHOID VACCINE PO CPDR: 1.0000 | DELAYED_RELEASE_CAPSULE | ORAL | 0 refills | Status: DC

## 2017-04-04 NOTE — Telephone Encounter (Signed)
Jay Schneider is traveling to [place]. Plan for Oral typhoid vaccine prior to travel. No history of allergy to this medicine.

## 2017-04-07 DIAGNOSIS — F418 Other specified anxiety disorders: Secondary | ICD-10-CM | POA: Diagnosis not present

## 2017-04-08 ENCOUNTER — Other Ambulatory Visit: Payer: Self-pay | Admitting: Family Medicine

## 2017-04-27 DIAGNOSIS — F419 Anxiety disorder, unspecified: Secondary | ICD-10-CM | POA: Diagnosis not present

## 2017-04-28 DIAGNOSIS — F418 Other specified anxiety disorders: Secondary | ICD-10-CM | POA: Diagnosis not present

## 2017-05-11 DIAGNOSIS — F419 Anxiety disorder, unspecified: Secondary | ICD-10-CM | POA: Diagnosis not present

## 2017-05-12 DIAGNOSIS — F418 Other specified anxiety disorders: Secondary | ICD-10-CM | POA: Diagnosis not present

## 2017-05-26 DIAGNOSIS — F418 Other specified anxiety disorders: Secondary | ICD-10-CM | POA: Diagnosis not present

## 2017-06-08 DIAGNOSIS — F419 Anxiety disorder, unspecified: Secondary | ICD-10-CM | POA: Diagnosis not present

## 2017-06-09 DIAGNOSIS — F418 Other specified anxiety disorders: Secondary | ICD-10-CM | POA: Diagnosis not present

## 2017-06-26 DIAGNOSIS — Z0000 Encounter for general adult medical examination without abnormal findings: Secondary | ICD-10-CM | POA: Diagnosis not present

## 2017-06-26 DIAGNOSIS — Z1211 Encounter for screening for malignant neoplasm of colon: Secondary | ICD-10-CM | POA: Diagnosis not present

## 2017-06-30 DIAGNOSIS — F418 Other specified anxiety disorders: Secondary | ICD-10-CM | POA: Diagnosis not present

## 2017-07-06 DIAGNOSIS — F419 Anxiety disorder, unspecified: Secondary | ICD-10-CM | POA: Diagnosis not present

## 2017-07-12 DIAGNOSIS — K219 Gastro-esophageal reflux disease without esophagitis: Secondary | ICD-10-CM | POA: Diagnosis not present

## 2017-07-12 DIAGNOSIS — Z1211 Encounter for screening for malignant neoplasm of colon: Secondary | ICD-10-CM | POA: Diagnosis not present

## 2017-07-20 DIAGNOSIS — F418 Other specified anxiety disorders: Secondary | ICD-10-CM | POA: Diagnosis not present

## 2017-08-01 DIAGNOSIS — L80 Vitiligo: Secondary | ICD-10-CM | POA: Diagnosis not present

## 2017-08-07 DIAGNOSIS — F902 Attention-deficit hyperactivity disorder, combined type: Secondary | ICD-10-CM | POA: Diagnosis not present

## 2017-08-07 DIAGNOSIS — Z79899 Other long term (current) drug therapy: Secondary | ICD-10-CM | POA: Diagnosis not present

## 2017-08-07 DIAGNOSIS — R41840 Attention and concentration deficit: Secondary | ICD-10-CM | POA: Diagnosis not present

## 2017-08-07 DIAGNOSIS — F419 Anxiety disorder, unspecified: Secondary | ICD-10-CM | POA: Diagnosis not present

## 2017-08-10 DIAGNOSIS — F418 Other specified anxiety disorders: Secondary | ICD-10-CM | POA: Diagnosis not present

## 2017-08-24 DIAGNOSIS — F418 Other specified anxiety disorders: Secondary | ICD-10-CM | POA: Diagnosis not present

## 2017-08-28 DIAGNOSIS — K640 First degree hemorrhoids: Secondary | ICD-10-CM | POA: Diagnosis not present

## 2017-08-28 DIAGNOSIS — Z1211 Encounter for screening for malignant neoplasm of colon: Secondary | ICD-10-CM | POA: Diagnosis not present

## 2017-08-28 DIAGNOSIS — K228 Other specified diseases of esophagus: Secondary | ICD-10-CM | POA: Diagnosis not present

## 2017-08-28 DIAGNOSIS — K2270 Barrett's esophagus without dysplasia: Secondary | ICD-10-CM | POA: Diagnosis not present

## 2017-08-28 DIAGNOSIS — K2950 Unspecified chronic gastritis without bleeding: Secondary | ICD-10-CM | POA: Diagnosis not present

## 2017-08-28 DIAGNOSIS — K3189 Other diseases of stomach and duodenum: Secondary | ICD-10-CM | POA: Diagnosis not present

## 2017-08-28 DIAGNOSIS — K2970 Gastritis, unspecified, without bleeding: Secondary | ICD-10-CM | POA: Diagnosis not present

## 2017-08-28 DIAGNOSIS — K648 Other hemorrhoids: Secondary | ICD-10-CM | POA: Diagnosis not present

## 2017-08-28 DIAGNOSIS — K219 Gastro-esophageal reflux disease without esophagitis: Secondary | ICD-10-CM | POA: Diagnosis not present

## 2017-09-13 DIAGNOSIS — F419 Anxiety disorder, unspecified: Secondary | ICD-10-CM | POA: Diagnosis not present

## 2017-09-13 DIAGNOSIS — F902 Attention-deficit hyperactivity disorder, combined type: Secondary | ICD-10-CM | POA: Diagnosis not present

## 2017-09-13 DIAGNOSIS — Z79899 Other long term (current) drug therapy: Secondary | ICD-10-CM | POA: Diagnosis not present

## 2017-09-20 ENCOUNTER — Other Ambulatory Visit: Payer: Self-pay

## 2017-09-20 DIAGNOSIS — F902 Attention-deficit hyperactivity disorder, combined type: Secondary | ICD-10-CM | POA: Diagnosis not present

## 2017-09-22 DIAGNOSIS — F418 Other specified anxiety disorders: Secondary | ICD-10-CM | POA: Diagnosis not present

## 2017-09-27 ENCOUNTER — Other Ambulatory Visit: Payer: Self-pay

## 2017-10-03 ENCOUNTER — Other Ambulatory Visit: Payer: Self-pay

## 2017-10-03 DIAGNOSIS — Z13220 Encounter for screening for lipoid disorders: Secondary | ICD-10-CM

## 2017-10-03 DIAGNOSIS — K219 Gastro-esophageal reflux disease without esophagitis: Secondary | ICD-10-CM

## 2017-10-03 DIAGNOSIS — Z125 Encounter for screening for malignant neoplasm of prostate: Secondary | ICD-10-CM

## 2017-10-03 DIAGNOSIS — E039 Hypothyroidism, unspecified: Secondary | ICD-10-CM

## 2017-10-04 LAB — CBC WITH DIFFERENTIAL/PLATELET
Basophils Absolute: 0 10*3/uL (ref 0.0–0.2)
Basos: 0 %
EOS (ABSOLUTE): 0.1 10*3/uL (ref 0.0–0.4)
Eos: 4 %
Hematocrit: 40.7 % (ref 37.5–51.0)
Hemoglobin: 13.2 g/dL (ref 13.0–17.7)
IMMATURE GRANS (ABS): 0 10*3/uL (ref 0.0–0.1)
IMMATURE GRANULOCYTES: 0 %
LYMPHS: 29 %
Lymphocytes Absolute: 1.1 10*3/uL (ref 0.7–3.1)
MCH: 27.6 pg (ref 26.6–33.0)
MCHC: 32.4 g/dL (ref 31.5–35.7)
MCV: 85 fL (ref 79–97)
Monocytes Absolute: 0.3 10*3/uL (ref 0.1–0.9)
Monocytes: 8 %
NEUTROS PCT: 59 %
Neutrophils Absolute: 2.3 10*3/uL (ref 1.4–7.0)
PLATELETS: 127 10*3/uL — AB (ref 150–450)
RBC: 4.78 x10E6/uL (ref 4.14–5.80)
RDW: 14.4 % (ref 12.3–15.4)
WBC: 3.9 10*3/uL (ref 3.4–10.8)

## 2017-10-04 LAB — MAGNESIUM: MAGNESIUM: 2.2 mg/dL (ref 1.6–2.3)

## 2017-10-04 LAB — VITAMIN B12: VITAMIN B 12: 350 pg/mL (ref 232–1245)

## 2017-10-04 LAB — PSA: PROSTATE SPECIFIC AG, SERUM: 0.7 ng/mL (ref 0.0–4.0)

## 2017-10-04 LAB — COMPREHENSIVE METABOLIC PANEL
ALT: 23 IU/L (ref 0–44)
AST: 29 IU/L (ref 0–40)
Albumin/Globulin Ratio: 2.4 — ABNORMAL HIGH (ref 1.2–2.2)
Albumin: 4.7 g/dL (ref 3.5–5.5)
Alkaline Phosphatase: 63 IU/L (ref 39–117)
BUN/Creatinine Ratio: 16 (ref 9–20)
BUN: 14 mg/dL (ref 6–24)
Bilirubin Total: 0.4 mg/dL (ref 0.0–1.2)
CALCIUM: 9.5 mg/dL (ref 8.7–10.2)
CO2: 24 mmol/L (ref 20–29)
CREATININE: 0.86 mg/dL (ref 0.76–1.27)
Chloride: 105 mmol/L (ref 96–106)
GFR calc Af Amer: 117 mL/min/{1.73_m2} (ref >59)
GFR, EST NON AFRICAN AMERICAN: 101 mL/min/{1.73_m2} (ref >59)
Globulin, Total: 2 g/dL (ref 1.5–4.5)
Glucose: 91 mg/dL (ref 65–99)
POTASSIUM: 4.4 mmol/L (ref 3.5–5.2)
Sodium: 144 mmol/L (ref 134–144)
Total Protein: 6.7 g/dL (ref 6.0–8.5)

## 2017-10-04 LAB — TSH: TSH: 6.8 u[IU]/mL — AB (ref 0.450–4.500)

## 2017-10-04 LAB — LIPID PANEL
CHOL/HDL RATIO: 4.9 ratio (ref 0.0–5.0)
Cholesterol, Total: 202 mg/dL — ABNORMAL HIGH (ref 100–199)
HDL: 41 mg/dL (ref >39)
LDL Calculated: 137 mg/dL — ABNORMAL HIGH (ref 0–99)
Triglycerides: 119 mg/dL (ref 0–149)
VLDL Cholesterol Cal: 24 mg/dL (ref 5–40)

## 2017-10-05 ENCOUNTER — Telehealth: Payer: Self-pay

## 2017-10-05 DIAGNOSIS — F418 Other specified anxiety disorders: Secondary | ICD-10-CM | POA: Diagnosis not present

## 2017-10-05 NOTE — Telephone Encounter (Signed)
Patient called our office requesting his complete lab report.  Dr. Terance HartBronstein (primary doctor) had contacted patient with the elevated TSH results and plan of care.  Patient is requesting a complete report at this time. [facility] notified of patient's request.  Spoke with [place] who will leave a message with the nurse to call the patient. Patient was notified of the plan and he will be anticipating a call [facility] with results.

## 2017-10-09 DIAGNOSIS — F902 Attention-deficit hyperactivity disorder, combined type: Secondary | ICD-10-CM | POA: Diagnosis not present

## 2017-10-15 ENCOUNTER — Other Ambulatory Visit: Payer: Self-pay | Admitting: Family Medicine

## 2017-10-18 ENCOUNTER — Other Ambulatory Visit: Payer: Self-pay | Admitting: Family Medicine

## 2017-10-18 DIAGNOSIS — Z79899 Other long term (current) drug therapy: Secondary | ICD-10-CM | POA: Diagnosis not present

## 2017-10-18 DIAGNOSIS — F419 Anxiety disorder, unspecified: Secondary | ICD-10-CM | POA: Diagnosis not present

## 2017-10-18 DIAGNOSIS — R41840 Attention and concentration deficit: Secondary | ICD-10-CM | POA: Diagnosis not present

## 2017-10-19 DIAGNOSIS — F418 Other specified anxiety disorders: Secondary | ICD-10-CM | POA: Diagnosis not present

## 2017-11-07 DIAGNOSIS — F902 Attention-deficit hyperactivity disorder, combined type: Secondary | ICD-10-CM | POA: Diagnosis not present

## 2017-11-09 DIAGNOSIS — F418 Other specified anxiety disorders: Secondary | ICD-10-CM | POA: Diagnosis not present

## 2017-11-22 DIAGNOSIS — F902 Attention-deficit hyperactivity disorder, combined type: Secondary | ICD-10-CM | POA: Diagnosis not present

## 2017-11-24 DIAGNOSIS — F331 Major depressive disorder, recurrent, moderate: Secondary | ICD-10-CM | POA: Diagnosis not present

## 2017-11-24 DIAGNOSIS — F909 Attention-deficit hyperactivity disorder, unspecified type: Secondary | ICD-10-CM | POA: Diagnosis not present

## 2017-11-24 DIAGNOSIS — F4010 Social phobia, unspecified: Secondary | ICD-10-CM | POA: Diagnosis not present

## 2017-12-01 DIAGNOSIS — F902 Attention-deficit hyperactivity disorder, combined type: Secondary | ICD-10-CM | POA: Diagnosis not present

## 2017-12-01 DIAGNOSIS — F418 Other specified anxiety disorders: Secondary | ICD-10-CM | POA: Diagnosis not present

## 2017-12-04 DIAGNOSIS — F4010 Social phobia, unspecified: Secondary | ICD-10-CM | POA: Insufficient documentation

## 2017-12-04 DIAGNOSIS — F909 Attention-deficit hyperactivity disorder, unspecified type: Secondary | ICD-10-CM | POA: Insufficient documentation

## 2017-12-04 DIAGNOSIS — G4730 Sleep apnea, unspecified: Secondary | ICD-10-CM | POA: Insufficient documentation

## 2017-12-06 DIAGNOSIS — F902 Attention-deficit hyperactivity disorder, combined type: Secondary | ICD-10-CM | POA: Diagnosis not present

## 2017-12-21 DIAGNOSIS — F418 Other specified anxiety disorders: Secondary | ICD-10-CM | POA: Diagnosis not present

## 2017-12-21 DIAGNOSIS — F902 Attention-deficit hyperactivity disorder, combined type: Secondary | ICD-10-CM | POA: Diagnosis not present

## 2017-12-25 ENCOUNTER — Ambulatory Visit (INDEPENDENT_AMBULATORY_CARE_PROVIDER_SITE_OTHER): Payer: BLUE CROSS/BLUE SHIELD | Admitting: Psychiatry

## 2017-12-25 DIAGNOSIS — F331 Major depressive disorder, recurrent, moderate: Secondary | ICD-10-CM | POA: Insufficient documentation

## 2017-12-25 DIAGNOSIS — F902 Attention-deficit hyperactivity disorder, combined type: Secondary | ICD-10-CM | POA: Diagnosis not present

## 2017-12-25 DIAGNOSIS — F4010 Social phobia, unspecified: Secondary | ICD-10-CM | POA: Diagnosis not present

## 2017-12-25 MED ORDER — BUPROPION HCL ER (XL) 150 MG PO TB24: 150.0000 mg | ORAL_TABLET | Freq: Every day | ORAL | 1 refills | Status: DC

## 2017-12-25 MED ORDER — AMPHETAMINE-DEXTROAMPHETAMINE 12.5 MG PO TABS: 25.0000 mg | ORAL_TABLET | Freq: Every day | ORAL | 0 refills | Status: DC

## 2017-12-25 MED ORDER — SERTRALINE HCL 100 MG PO TABS: 100.0000 mg | ORAL_TABLET | Freq: Every day | ORAL | 1 refills | Status: DC

## 2017-12-25 NOTE — Progress Notes (Signed)
[facility]  Patient ID: Jay Schneider,  MRN: [id number]  PCP: Dorothey Baseman, MD  Date of Evaluation: 12/25/2017 Time spent:20 minutes   HISTORY/CURRENT STATUS: HPI patient is a 52 year old white male with initial visit on 11/24/2017.  He was diagnosed with social anxiety depression and ADHD.  Patient has had ADHD most of his life.  Been on medicine since April.  No help.  Social anxiety for a long time he is obsessed with doing things wrong.  Depression was worsening he also has a also has deep shame.  He was put on Zoloft last visit.  BuSpar Adderall was started at XR 15 mg 1 a day Vyvanse was stopped.  Wellbutrin was decreased from 300 XL a day to 150 XL. Patient states his ADHD anxiety and depression are all about the same.  He is only been on the 50 mg of Zoloft for 3 weeks. Individual Medical History/ Review of Systems: Changes? :No  Allergies: Patient has no known allergies.  Current Medications:  Current Outpatient Medications:  .  AMBULATORY NON FORMULARY MEDICATION, Continuous positive airway pressure (CPAP) machine auto-titrate from 4-20 cm of H2O pressure, with all supplemental supplies as needed. Please fax 7 day data to [phone], Disp: 1 each, Rfl: 0 .  amphetamine-dextroamphetamine (ADDERALL) 10 MG tablet, Take 10 mg by mouth daily with breakfast., Disp: , Rfl:  .  baclofen (LIORESAL) 10 MG tablet, TAKE 1-2 TABLETS AT BEDTIME, Disp: 90 tablet, Rfl: 2 .  buPROPion (WELLBUTRIN XL) 150 MG 24 hr tablet, , Disp: , Rfl: 2 .  diclofenac sodium (VOLTAREN) 1 % GEL, Apply 2 g topically 4 (four) times daily. To affected joint., Disp: 100 g, Rfl: 11 .  levothyroxine (SYNTHROID, LEVOTHROID) 50 MCG tablet, Take by mouth., Disp: , Rfl:  .  sertraline (ZOLOFT) 25 MG tablet, Take 50 mg by mouth daily., Disp: , Rfl:  .  typhoid (VIVOTIF) DR capsule, Take 1 capsule by mouth every other day., Disp: 4 capsule, Rfl: 0 Medication Side Effects: None  Family  Medical/ Social History: Changes? No  MENTAL HEALTH EXAM:  There were no vitals taken for this visit.There is no height or weight on file to calculate BMI.  General Appearance: Casual  Eye Contact:  Good  Speech:  Normal Rate  Volume:  Normal  Mood:  Anxious and Depressed  Affect:  Appropriate  Thought Process:  Coherent  Orientation:  Full (Time, Place, and Person)  Thought Content: Logical   Suicidal Thoughts:  No  Homicidal Thoughts:  No  Memory:  normal  Judgement:  Good  Insight:  Good  Psychomotor Activity:  Normal  Concentration:  Concentration: Good  Recall:  Good  Fund of Knowledge: Good  Language: Good  Akathisia:  NA  AIMS (if indicated): na  Assets:  Desire for Improvement  ADL's:  Intact  Cognition: WNL  Prognosis:  Good    DIAGNOSES: 1. Attention deficit hyperactivity disorder (ADHD), combined type - amphetamine-dextroamphetamine (ADDERALL) 12.5 MG tablet; Take 2 tablets by mouth daily with breakfast.  Dispense: 60 tablet; Refill: 0  2. Moderate episode of recurrent major depressive disorder (HCC) - buPROPion (WELLBUTRIN XL) 150 MG 24 hr tablet; Take 1 tablet (150 mg total) by mouth daily.  Dispense: 30 tablet; Refill: 1 - sertraline (ZOLOFT) 100 MG tablet; Take 1 tablet (100 mg total) by mouth daily.  Dispense: 30 tablet; Refill: 1  3. Social anxiety disorder - sertraline (ZOLOFT) 100 MG tablet; Take 1 tablet (100 mg total) by mouth  daily.  Dispense: 30 tablet; Refill: 1   RECOMMENDATIONS: Increase Zoloft from 50 to 100 mg a day.  Increase Adderall from 15 mg to 25 mg a day.  Continue the Wellbutrin.  Call if you have any problems and  in 1 month.    Anne Fu, PA-C

## 2017-12-25 NOTE — Patient Instructions (Signed)
Patient is to continue Wellbutrin XL at 150.  He is to increase his Zoloft 200 mg a day.  We are increasing his Adderall 15 mg to 25 mg a day

## 2017-12-28 DIAGNOSIS — F902 Attention-deficit hyperactivity disorder, combined type: Secondary | ICD-10-CM | POA: Diagnosis not present

## 2018-01-03 ENCOUNTER — Telehealth: Payer: Self-pay | Admitting: Psychiatry

## 2018-01-03 NOTE — Telephone Encounter (Signed)
Pt wondering if adderall causing increase frequency of urination and 1 episode urinary incontinence.  Interested in methylphenidate Will discuss at upcoming visit

## 2018-01-04 DIAGNOSIS — F902 Attention-deficit hyperactivity disorder, combined type: Secondary | ICD-10-CM | POA: Diagnosis not present

## 2018-01-04 DIAGNOSIS — F418 Other specified anxiety disorders: Secondary | ICD-10-CM | POA: Diagnosis not present

## 2018-01-10 DIAGNOSIS — F902 Attention-deficit hyperactivity disorder, combined type: Secondary | ICD-10-CM | POA: Diagnosis not present

## 2018-01-16 DIAGNOSIS — F902 Attention-deficit hyperactivity disorder, combined type: Secondary | ICD-10-CM | POA: Diagnosis not present

## 2018-01-18 DIAGNOSIS — F902 Attention-deficit hyperactivity disorder, combined type: Secondary | ICD-10-CM | POA: Diagnosis not present

## 2018-01-18 DIAGNOSIS — F418 Other specified anxiety disorders: Secondary | ICD-10-CM | POA: Diagnosis not present

## 2018-01-22 ENCOUNTER — Ambulatory Visit: Payer: BLUE CROSS/BLUE SHIELD | Admitting: Psychiatry

## 2018-01-24 ENCOUNTER — Ambulatory Visit: Payer: BLUE CROSS/BLUE SHIELD | Admitting: Psychiatry

## 2018-01-24 DIAGNOSIS — F909 Attention-deficit hyperactivity disorder, unspecified type: Secondary | ICD-10-CM

## 2018-01-24 DIAGNOSIS — F329 Major depressive disorder, single episode, unspecified: Secondary | ICD-10-CM

## 2018-01-24 DIAGNOSIS — F32A Depression, unspecified: Secondary | ICD-10-CM

## 2018-01-24 DIAGNOSIS — F4010 Social phobia, unspecified: Secondary | ICD-10-CM | POA: Diagnosis not present

## 2018-01-24 DIAGNOSIS — F331 Major depressive disorder, recurrent, moderate: Secondary | ICD-10-CM | POA: Diagnosis not present

## 2018-01-24 MED ORDER — SERTRALINE HCL 100 MG PO TABS: 100.0000 mg | ORAL_TABLET | Freq: Every day | ORAL | 1 refills | Status: DC

## 2018-01-24 MED ORDER — LISDEXAMFETAMINE DIMESYLATE 40 MG PO CAPS: 40.0000 mg | ORAL_CAPSULE | ORAL | 0 refills | Status: DC

## 2018-01-24 MED ORDER — BUPROPION HCL ER (XL) 150 MG PO TB24: 150.0000 mg | ORAL_TABLET | Freq: Every day | ORAL | 1 refills | Status: DC

## 2018-01-24 NOTE — Progress Notes (Addendum)
bp 130/86  Pulse 58bpm     [facility]  Patient ID: Jay Schneider,  MRN: [id number]  PCP: Dorothey Baseman, MD  Date of Evaluation: 01/24/2018 Time spent:20 minutes  Chief Complaint:   HISTORY/CURRENT STATUS: HPI patient last seen 12/25/2017 with having worsening depression.  Other diagnoses include ADHD and social anxiety.  At the time we increase his Adderall to 12 to 25 mg a day.  He has decreased it back to 12.5 and stopped it 10 days ago.  Zoloft was increased to 100 mg a day.  Continues on Wellbutrin XL 150.  Currently he has less anxiety and less depression.  Wants to switch to Vyvanse as he has used in the past.  He felt that his depression made his ADHD worse.  Individual Medical History/ Review of Systems: Changes? :No   Allergies: Patient has no known allergies.  Current Medications:  Current Outpatient Medications:  .  baclofen (LIORESAL) 10 MG tablet, TAKE 1-2 TABLETS AT BEDTIME, Disp: 90 tablet, Rfl: 2 .  buPROPion (WELLBUTRIN XL) 150 MG 24 hr tablet, Take 1 tablet (150 mg total) by mouth daily., Disp: 30 tablet, Rfl: 1 .  omeprazole (PRILOSEC) 10 MG capsule, Take 10 mg by mouth daily., Disp: , Rfl:  .  sertraline (ZOLOFT) 100 MG tablet, Take 1 tablet (100 mg total) by mouth daily., Disp: 30 tablet, Rfl: 1 .  AMBULATORY NON FORMULARY MEDICATION, Continuous positive airway pressure (CPAP) machine auto-titrate from 4-20 cm of H2O pressure, with all supplemental supplies as needed. Please fax 7 day data to [phone], Disp: 1 each, Rfl: 0 .  levothyroxine (SYNTHROID, LEVOTHROID) 50 MCG tablet, Take by mouth., Disp: , Rfl:  .  lisdexamfetamine (VYVANSE) 40 MG capsule, Take 1 capsule (40 mg total) by mouth every morning., Disp: 30 capsule, Rfl: 0 Medication Side Effects: none  Family Medical/ Social History: Changes? No  MENTAL HEALTH EXAM:  There were no vitals taken for this visit.There is no height or weight on file to calculate BMI.  General  Appearance: Casual  Eye Contact:  Good  Speech:  Clear and Coherent  Volume:  Normal  Mood:  Euthymic  Affect:  Appropriate  Thought Process:  Goal Directed  Orientation:  Full (Time, Place, and Person)  Thought Content: Logical   Suicidal Thoughts:  No  Homicidal Thoughts:  No  Memory:  WNL  Judgement:  Good  Insight:  Good  Psychomotor Activity:  Normal  Concentration:  Concentration: Good  Recall:  Good  Fund of Knowledge: Good  Language: Good  Assets:  Desire for Improvement  ADL's:  Intact  Cognition: WNL  Prognosis:  Good    DIAGNOSES:    ICD-10-CM   1. Attention deficit hyperactivity disorder (ADHD), unspecified ADHD type F90.9   2. Depression, unspecified depression type F32.9   3. Social anxiety disorder F40.10 sertraline (ZOLOFT) 100 MG tablet  4. Moderate episode of recurrent major depressive disorder (HCC) F33.1 sertraline (ZOLOFT) 100 MG tablet    buPROPion (WELLBUTRIN XL) 150 MG 24 hr tablet    Receiving Psychotherapy: No    RECOMMENDATIONS: to start vyvanse 40 mg/day. ON 60mg  in past Continue zoloft 100mg /dayu and wellbutrin xl 150 mg/day   Anne Fu, PA-C

## 2018-01-25 DIAGNOSIS — F902 Attention-deficit hyperactivity disorder, combined type: Secondary | ICD-10-CM | POA: Diagnosis not present

## 2018-02-07 DIAGNOSIS — F902 Attention-deficit hyperactivity disorder, combined type: Secondary | ICD-10-CM | POA: Diagnosis not present

## 2018-02-12 DIAGNOSIS — F902 Attention-deficit hyperactivity disorder, combined type: Secondary | ICD-10-CM | POA: Diagnosis not present

## 2018-02-21 ENCOUNTER — Ambulatory Visit: Payer: BLUE CROSS/BLUE SHIELD | Admitting: Psychiatry

## 2018-02-21 DIAGNOSIS — F331 Major depressive disorder, recurrent, moderate: Secondary | ICD-10-CM | POA: Diagnosis not present

## 2018-02-21 DIAGNOSIS — F909 Attention-deficit hyperactivity disorder, unspecified type: Secondary | ICD-10-CM

## 2018-02-21 DIAGNOSIS — F4010 Social phobia, unspecified: Secondary | ICD-10-CM | POA: Diagnosis not present

## 2018-02-21 MED ORDER — BUPROPION HCL ER (XL) 150 MG PO TB24: 150.0000 mg | ORAL_TABLET | Freq: Every day | ORAL | 1 refills | Status: DC

## 2018-02-21 MED ORDER — LISDEXAMFETAMINE DIMESYLATE 60 MG PO CAPS: 60.0000 mg | ORAL_CAPSULE | ORAL | 0 refills | Status: DC

## 2018-02-21 MED ORDER — SERTRALINE HCL 100 MG PO TABS: 150.0000 mg | ORAL_TABLET | Freq: Every day | ORAL | 2 refills | Status: DC

## 2018-02-21 MED ORDER — LISDEXAMFETAMINE DIMESYLATE 60 MG PO CAPS: 60.0000 mg | ORAL_CAPSULE | Freq: Every day | ORAL | 0 refills | Status: DC

## 2018-02-21 NOTE — Progress Notes (Signed)
[facility]  Patient ID: Jay Schneider,  MRN: [id number]  PCP: Dorothey BasemanBronstein, David, MD  Date of Evaluation: 02/21/2018 Time spent:20 minutes  Chief Complaint:   HISTORY/CURRENT STATUS: HPI patient seen 01/24/2018.  Diagnosis of ADHD, depression, anxiety.  He was doing better at his last visit so we continued the Zoloft and Wellbutrin.  He desired a change from Adderall to Vyvanse restarted on Vyvanse 40 mg a day. Currently the patient feels his depression and anxiety are better.  He is noticed no help from the Vyvanse 40 mg a day.  Individual Medical History/ Review of Systems: Changes? :No   Allergies: Patient has no known allergies.  Current Medications:  Current Outpatient Medications:  .  buPROPion (WELLBUTRIN XL) 150 MG 24 hr tablet, Take 1 tablet (150 mg total) by mouth daily., Disp: 30 tablet, Rfl: 1 .  lisdexamfetamine (VYVANSE) 60 MG capsule, Take 1 capsule (60 mg total) by mouth daily., Disp: 30 capsule, Rfl: 0 .  sertraline (ZOLOFT) 100 MG tablet, Take 1.5 tablets (150 mg total) by mouth daily., Disp: 45 tablet, Rfl: 2 .  AMBULATORY NON FORMULARY MEDICATION, Continuous positive airway pressure (CPAP) machine auto-titrate from 4-20 cm of H2O pressure, with all supplemental supplies as needed. Please fax 7 day data to [phone], Disp: 1 each, Rfl: 0 .  baclofen (LIORESAL) 10 MG tablet, TAKE 1-2 TABLETS AT BEDTIME, Disp: 90 tablet, Rfl: 2 .  levothyroxine (SYNTHROID, LEVOTHROID) 50 MCG tablet, Take by mouth., Disp: , Rfl:  .  [START ON 03/21/2018] lisdexamfetamine (VYVANSE) 60 MG capsule, Take 1 capsule (60 mg total) by mouth every morning., Disp: 30 capsule, Rfl: 0 .  lisdexamfetamine (VYVANSE) 60 MG capsule, Take 1 capsule (60 mg total) by mouth every morning., Disp: 30 capsule, Rfl: 0 .  omeprazole (PRILOSEC) 10 MG capsule, Take 10 mg by mouth daily., Disp: , Rfl:  Medication Side Effects: none  Family Medical/ Social History: Changes?  No  MENTAL HEALTH EXAM: 130/87 with pulse 55  There were no vitals taken for this visit.There is no height or weight on file to calculate BMI.  General Appearance: Casual  Eye Contact:  Good  Speech:  Clear and Coherent  Volume:  Normal  Mood:  Euthymic  Affect:  Appropriate  Thought Process:  Linear  Orientation:  Full (Time, Place, and Person)  Thought Content: Logical   Suicidal Thoughts:  No  Homicidal Thoughts:  No  Memory:  WNL  Judgement:  Good  Insight:  Good  Psychomotor Activity:  Normal  Concentration:  Concentration: Good  Recall:  Good  Fund of Knowledge: Good  Language: Good  Assets:  Desire for Improvement  ADL's:  Intact  Cognition: WNL  Prognosis:  Good    DIAGNOSES:    ICD-10-CM   1. Attention deficit hyperactivity disorder (ADHD), unspecified ADHD type F90.9   2. Social anxiety disorder F40.10 sertraline (ZOLOFT) 100 MG tablet  3. Moderate episode of recurrent major depressive disorder (HCC) F33.1 buPROPion (WELLBUTRIN XL) 150 MG 24 hr tablet    sertraline (ZOLOFT) 100 MG tablet    Receiving Psychotherapy: No    RECOMMENDATIONS: We will increase the Zoloft to 150 mg to help with anxiety and depression.  Continue Wellbutrin XL 150.  Patient is to start 60 mg of Vyvanse.  If no benefit in a month let me know. Revisit in 6 weeks   Anne Fulay Leigh Blas, [place]PA-C

## 2018-02-22 DIAGNOSIS — F902 Attention-deficit hyperactivity disorder, combined type: Secondary | ICD-10-CM | POA: Diagnosis not present

## 2018-02-28 DIAGNOSIS — F902 Attention-deficit hyperactivity disorder, combined type: Secondary | ICD-10-CM | POA: Diagnosis not present

## 2018-03-09 DIAGNOSIS — F902 Attention-deficit hyperactivity disorder, combined type: Secondary | ICD-10-CM | POA: Diagnosis not present

## 2018-03-22 DIAGNOSIS — F902 Attention-deficit hyperactivity disorder, combined type: Secondary | ICD-10-CM | POA: Diagnosis not present

## 2018-03-27 ENCOUNTER — Ambulatory Visit: Payer: BLUE CROSS/BLUE SHIELD | Admitting: Psychiatry

## 2018-03-27 DIAGNOSIS — F909 Attention-deficit hyperactivity disorder, unspecified type: Secondary | ICD-10-CM | POA: Diagnosis not present

## 2018-03-27 DIAGNOSIS — F4010 Social phobia, unspecified: Secondary | ICD-10-CM

## 2018-03-27 DIAGNOSIS — F331 Major depressive disorder, recurrent, moderate: Secondary | ICD-10-CM

## 2018-03-27 MED ORDER — SERTRALINE HCL 100 MG PO TABS: ORAL_TABLET | ORAL | 0 refills | Status: DC

## 2018-03-27 MED ORDER — METHYLPHENIDATE HCL ER (OSM) 36 MG PO TBCR: 36.0000 mg | EXTENDED_RELEASE_TABLET | Freq: Every day | ORAL | 0 refills | Status: DC

## 2018-03-27 MED ORDER — PAROXETINE HCL 10 MG PO TABS: ORAL_TABLET | ORAL | 0 refills | Status: DC

## 2018-03-27 MED ORDER — BUPROPION HCL ER (XL) 150 MG PO TB24: 150.0000 mg | ORAL_TABLET | Freq: Every day | ORAL | 1 refills | Status: DC

## 2018-03-27 NOTE — Progress Notes (Signed)
[facility]  Patient ID: Jay Schneider,  MRN: [id number]  PCP: Dorothey Baseman, MD  Date of Evaluation: 03/27/2018 Time spent:20 minutes  Chief Complaint:   HISTORY/CURRENT STATUS: HPI patient seen patient seen 02/21/2018.  We increased his Zoloft and his Vyvanse.  Currently he feels about the same no improvement in your depression no suicidal thoughts anxiety is about the same. He has had problems with his medications of Vyvanse made him had urinary incontinence in the Zoloft he has a flu feeling several times a day for several months brief episodes.  Individual Medical History/ Review of Systems: Changes? :No   Allergies: Patient has no known allergies.  Current Medications:  Current Outpatient Medications:  .  AMBULATORY NON FORMULARY MEDICATION, Continuous positive airway pressure (CPAP) machine auto-titrate from 4-20 cm of H2O pressure, with all supplemental supplies as needed. Please fax 7 day data to [phone], Disp: 1 each, Rfl: 0 .  baclofen (LIORESAL) 10 MG tablet, TAKE 1-2 TABLETS AT BEDTIME, Disp: 90 tablet, Rfl: 2 .  buPROPion (WELLBUTRIN XL) 150 MG 24 hr tablet, Take 1 tablet (150 mg total) by mouth daily., Disp: 30 tablet, Rfl: 1 .  levothyroxine (SYNTHROID, LEVOTHROID) 50 MCG tablet, Take by mouth., Disp: , Rfl:  .  lisdexamfetamine (VYVANSE) 60 MG capsule, Take 1 capsule (60 mg total) by mouth daily., Disp: 30 capsule, Rfl: 0 .  lisdexamfetamine (VYVANSE) 60 MG capsule, Take 1 capsule (60 mg total) by mouth every morning., Disp: 30 capsule, Rfl: 0 .  lisdexamfetamine (VYVANSE) 60 MG capsule, Take 1 capsule (60 mg total) by mouth every morning., Disp: 30 capsule, Rfl: 0 .  omeprazole (PRILOSEC) 10 MG capsule, Take 10 mg by mouth daily., Disp: , Rfl:  .  sertraline (ZOLOFT) 100 MG tablet, Take 1.5 tablets (150 mg total) by mouth daily., Disp: 45 tablet, Rfl: 2 Medication Side Effects: see hpi  Family Medical/ Social History: Changes?  No  MENTAL HEALTH EXAM:  There were no vitals taken for this visit.There is no height or weight on file to calculate BMI.  General Appearance: Casual  Eye Contact:  Good  Speech:  Clear and Coherent  Volume:  Normal  Mood:  Depressed  Affect:  Appropriate  Thought Process:  Goal Directed  Orientation:  Full (Time, Place, and Person)  Thought Content: Logical   Suicidal Thoughts:  No  Homicidal Thoughts:  No  Memory:  WNL  Judgement:  Good  Insight:  Good  Psychomotor Activity:  Normal  Concentration:  Concentration: Good  Recall:  Good  Fund of Knowledge: Good  Language: Good  Assets:  Desire for Improvement  ADL's:  Intact  Cognition: WNL  Prognosis:  Good    DIAGNOSES: No diagnosis found.  Receiving Psychotherapy: No    RECOMMENDATIONS: Patient is to decrease Zoloft by 50 mg a week he currently is at 150 mg.  He is to stop the Vyvanse.  He is to start Concerta 36 mg a day.  He is to start Paxil 10 mg a day for a week and then 20 mg.   Anne Fu, PA-C

## 2018-03-28 ENCOUNTER — Other Ambulatory Visit: Payer: Self-pay | Admitting: Psychiatry

## 2018-03-28 MED ORDER — METHYLPHENIDATE HCL ER (OSM) 36 MG PO TBCR: 36.0000 mg | EXTENDED_RELEASE_TABLET | Freq: Every day | ORAL | 0 refills | Status: DC

## 2018-04-03 DIAGNOSIS — F902 Attention-deficit hyperactivity disorder, combined type: Secondary | ICD-10-CM | POA: Diagnosis not present

## 2018-04-12 DIAGNOSIS — F902 Attention-deficit hyperactivity disorder, combined type: Secondary | ICD-10-CM | POA: Diagnosis not present

## 2018-04-20 DIAGNOSIS — F902 Attention-deficit hyperactivity disorder, combined type: Secondary | ICD-10-CM | POA: Diagnosis not present

## 2018-04-24 ENCOUNTER — Ambulatory Visit: Payer: BLUE CROSS/BLUE SHIELD | Admitting: Psychiatry

## 2018-04-24 DIAGNOSIS — F4010 Social phobia, unspecified: Secondary | ICD-10-CM | POA: Diagnosis not present

## 2018-04-24 DIAGNOSIS — F331 Major depressive disorder, recurrent, moderate: Secondary | ICD-10-CM

## 2018-04-24 DIAGNOSIS — F909 Attention-deficit hyperactivity disorder, unspecified type: Secondary | ICD-10-CM

## 2018-04-24 MED ORDER — METHYLPHENIDATE HCL ER (OSM) 54 MG PO TBCR: 54.0000 mg | EXTENDED_RELEASE_TABLET | ORAL | 0 refills | Status: DC

## 2018-04-24 MED ORDER — BUPROPION HCL ER (XL) 150 MG PO TB24: 150.0000 mg | ORAL_TABLET | Freq: Every day | ORAL | 1 refills | Status: DC

## 2018-04-24 MED ORDER — PAROXETINE HCL 10 MG PO TABS: ORAL_TABLET | ORAL | 0 refills | Status: DC

## 2018-04-24 NOTE — Progress Notes (Signed)
[facility]  Patient ID: Jay Schneider,  MRN: [id number]  PCP: Dorothey Baseman, MD  Date of Evaluation: 04/24/2018 Time spent:20 minutes  Chief Complaint:   HISTORY/CURRENT STATUS: HPI patient seen 03/27/2018 changing his Zoloft to Paxil and Vyvanse to Concerta as they were not working or had side effects. Patient basically feels the same.  Continues with depression anxiety and ADHD.  Individual Medical History/ Review of Systems: Changes? :No   Allergies: Patient has no known allergies.  Current Medications:  Current Outpatient Medications:  .  buPROPion (WELLBUTRIN XL) 150 MG 24 hr tablet, Take 1 tablet (150 mg total) by mouth daily., Disp: 30 tablet, Rfl: 1 .  methylphenidate (CONCERTA) 36 MG PO CR tablet, Take 1 tablet (36 mg total) by mouth daily., Disp: 30 tablet, Rfl: 0 .  PARoxetine (PAXIL) 10 MG tablet, 1  A day for a week, then 2 a day, Disp: 60 tablet, Rfl: 0 .  AMBULATORY NON FORMULARY MEDICATION, Continuous positive airway pressure (CPAP) machine auto-titrate from 4-20 cm of H2O pressure, with all supplemental supplies as needed. Please fax 7 day data to [phone], Disp: 1 each, Rfl: 0 .  baclofen (LIORESAL) 10 MG tablet, TAKE 1-2 TABLETS AT BEDTIME, Disp: 90 tablet, Rfl: 2 .  levothyroxine (SYNTHROID, LEVOTHROID) 50 MCG tablet, Take by mouth., Disp: , Rfl:  .  lisdexamfetamine (VYVANSE) 60 MG capsule, Take 1 capsule (60 mg total) by mouth every morning. (Patient not taking: Reported on 03/27/2018), Disp: 30 capsule, Rfl: 0 .  lisdexamfetamine (VYVANSE) 60 MG capsule, Take 1 capsule (60 mg total) by mouth every morning. (Patient not taking: Reported on 03/27/2018), Disp: 30 capsule, Rfl: 0 .  methylphenidate (CONCERTA) 54 MG PO CR tablet, Take 1 tablet (54 mg total) by mouth every morning., Disp: 30 tablet, Rfl: 0 .  omeprazole (PRILOSEC) 10 MG capsule, Take 10 mg by mouth daily., Disp: , Rfl:  .  sertraline (ZOLOFT) 100 MG tablet, 1 a day for 1  week, then 1/2 /day for a week, then stop (Patient not taking: Reported on 04/24/2018), Disp: 11 tablet, Rfl: 0 Medication Side Effects: none  Family Medical/ Social History: Changes? No  MENTAL HEALTH EXAM:  There were no vitals taken for this visit.There is no height or weight on file to calculate BMI.  General Appearance: Casual  Eye Contact:  Good  Speech:  clear  Volume:  Normal  Mood:  Euthymic  Affect:  Appropriate  Thought Process:  Linear  Orientation:  Full (Time, Place, and Person)  Thought Content: Logical   Suicidal Thoughts:  No  Homicidal Thoughts:  No  Memory:  WNL  Judgement:  Good  Insight:  Good  Psychomotor Activity:  Normal  Concentration:  Concentration: Good  Recall:  Good  Fund of Knowledge: Good  Language: Good  Assets:  Desire for Improvement  ADL's:  Intact  Cognition: WNL  Prognosis:  Good    DIAGNOSES:    ICD-10-CM   1. Attention deficit hyperactivity disorder (ADHD), unspecified ADHD type F90.9   2. Moderate episode of recurrent major depressive disorder (HCC) F33.1 buPROPion (WELLBUTRIN XL) 150 MG 24 hr tablet  3. Social anxiety disorder F40.10     Receiving Psychotherapy: No    RECOMMENDATIONS: Patient is to increase his Concerta to 54 mg a day.  We will keep his Paxil at 20 mg a day as he is only been on it 3 to 4 weeks. We will see him again in 1 month.   Mat Carne  Marlyn Tondreau, PA-C

## 2018-04-27 DIAGNOSIS — F902 Attention-deficit hyperactivity disorder, combined type: Secondary | ICD-10-CM | POA: Diagnosis not present

## 2018-04-29 ENCOUNTER — Other Ambulatory Visit: Payer: Self-pay | Admitting: Family Medicine

## 2018-05-01 DIAGNOSIS — F902 Attention-deficit hyperactivity disorder, combined type: Secondary | ICD-10-CM | POA: Diagnosis not present

## 2018-05-04 ENCOUNTER — Telehealth: Payer: Self-pay | Admitting: Family Medicine

## 2018-05-04 MED ORDER — BACLOFEN 20 MG PO TABS: 10.0000 mg | ORAL_TABLET | Freq: Every evening | ORAL | 3 refills | Status: DC | PRN

## 2018-05-04 NOTE — Telephone Encounter (Signed)
Baclofen refilled.

## 2018-05-17 DIAGNOSIS — F902 Attention-deficit hyperactivity disorder, combined type: Secondary | ICD-10-CM | POA: Diagnosis not present

## 2018-05-22 ENCOUNTER — Ambulatory Visit: Payer: BLUE CROSS/BLUE SHIELD | Admitting: Psychiatry

## 2018-05-22 DIAGNOSIS — F909 Attention-deficit hyperactivity disorder, unspecified type: Secondary | ICD-10-CM

## 2018-05-22 DIAGNOSIS — F331 Major depressive disorder, recurrent, moderate: Secondary | ICD-10-CM | POA: Diagnosis not present

## 2018-05-22 DIAGNOSIS — F4010 Social phobia, unspecified: Secondary | ICD-10-CM | POA: Diagnosis not present

## 2018-05-22 MED ORDER — DEXMETHYLPHENIDATE HCL ER 20 MG PO CP24: 20.0000 mg | ORAL_CAPSULE | Freq: Every day | ORAL | 0 refills | Status: DC

## 2018-05-22 MED ORDER — PAROXETINE HCL 10 MG PO TABS: ORAL_TABLET | ORAL | 0 refills | Status: DC

## 2018-05-22 MED ORDER — BUPROPION HCL ER (XL) 150 MG PO TB24: 150.0000 mg | ORAL_TABLET | Freq: Every day | ORAL | 1 refills | Status: DC

## 2018-05-22 NOTE — Progress Notes (Signed)
[facility]  Patient ID: Jay Schneider,  MRN: [id number]  PCP: Dorothey Baseman, MD  Date of Evaluation: 05/22/2018 Time spent:20 minutes  Chief Complaint:   HISTORY/CURRENT STATUS: HPI patient was seen 1 month ago.  Treated for ADHD, major depressive disorder recurrent, social anxiety.  At that time we increased her his Concerta to 54 mg a day and continue him on his Paxil 20 mg a day Mentally he is better feel that the Paxil is helping but he is waking up 4 times during the night to go to the bathroom.  He has a lot of gas and has frequent bowel movements.  Most likely it is from the Concerta  Individual Medical History/ Review of Systems: Changes? :No   Allergies: Patient has no known allergies.  Current Medications:  Current Outpatient Medications:  .  buPROPion (WELLBUTRIN XL) 150 MG 24 hr tablet, Take 1 tablet (150 mg total) by mouth daily., Disp: 30 tablet, Rfl: 1 .  methylphenidate (CONCERTA) 54 MG PO CR tablet, Take 1 tablet (54 mg total) by mouth every morning., Disp: 30 tablet, Rfl: 0 .  PARoxetine (PAXIL) 10 MG tablet, 2 per day, Disp: 60 tablet, Rfl: 0 .  AMBULATORY NON FORMULARY MEDICATION, Continuous positive airway pressure (CPAP) machine auto-titrate from 4-20 cm of H2O pressure, with all supplemental supplies as needed. Please fax 7 day data to [phone], Disp: 1 each, Rfl: 0 .  baclofen (LIORESAL) 20 MG tablet, Take 0.5-1 tablets (10-20 mg total) by mouth at bedtime as needed for muscle spasms., Disp: 90 tablet, Rfl: 3 .  dexmethylphenidate (FOCALIN XR) 20 MG 24 hr capsule, Take 1 capsule (20 mg total) by mouth daily., Disp: 30 capsule, Rfl: 0 .  levothyroxine (SYNTHROID, LEVOTHROID) 50 MCG tablet, Take by mouth., Disp: , Rfl:  .  lisdexamfetamine (VYVANSE) 60 MG capsule, Take 1 capsule (60 mg total) by mouth every morning. (Patient not taking: Reported on 03/27/2018), Disp: 30 capsule, Rfl: 0 .  lisdexamfetamine (VYVANSE) 60 MG capsule,  Take 1 capsule (60 mg total) by mouth every morning. (Patient not taking: Reported on 03/27/2018), Disp: 30 capsule, Rfl: 0 .  methylphenidate (CONCERTA) 36 MG PO CR tablet, Take 1 tablet (36 mg total) by mouth daily. (Patient not taking: Reported on 05/22/2018), Disp: 30 tablet, Rfl: 0 .  omeprazole (PRILOSEC) 10 MG capsule, Take 10 mg by mouth daily., Disp: , Rfl:  .  sertraline (ZOLOFT) 100 MG tablet, 1 a day for 1 week, then 1/2 /day for a week, then stop (Patient not taking: Reported on 04/24/2018), Disp: 11 tablet, Rfl: 0 Medication Side Effects: none  Family Medical/ Social History: Changes? no  MENTAL HEALTH EXAM:  There were no vitals taken for this visit.There is no height or weight on file to calculate BMI.  General Appearance: Casual  Eye Contact:  Good  Speech:  Clear and Coherent  Volume:  Normal  Mood:  Euthymic  Affect:  Appropriate  Thought Process:  Goal Directed  Orientation:  Full (Time, Place, and Person)  Thought Content: Logical   Suicidal Thoughts:  No  Homicidal Thoughts:  No  Memory:  WNL  Judgement:  Good  Insight:  Good  Psychomotor Activity:  Normal  Concentration:  Concentration: Good  Recall:  Good  Fund of Knowledge: Good  Language: Good  Assets:  Desire for Improvement  ADL's:  Intact  Cognition: WNL  Prognosis:  Good    DIAGNOSES:    ICD-10-CM   1. Attention deficit hyperactivity  disorder (ADHD), unspecified ADHD type F90.9   2. Social anxiety disorder F40.10   3. Moderate episode of recurrent major depressive disorder (HCC) F33.1 buPROPion (WELLBUTRIN XL) 150 MG 24 hr tablet    Receiving Psychotherapy: No    RECOMMENDATIONS: We discussed the plan with Dr. Haywood Lasso.  We are going to have him stop Concerta.  Then he should start the Focalin Exar at 20 mg a day he is to continue He is to continue Wellbutrin and Paxil. He should return in 1 month call if he has problems before then.  His vital signs today blood pressure was 139/89 with a pulse of  [address], PA-C

## 2018-05-25 DIAGNOSIS — F902 Attention-deficit hyperactivity disorder, combined type: Secondary | ICD-10-CM | POA: Diagnosis not present

## 2018-05-29 ENCOUNTER — Ambulatory Visit (INDEPENDENT_AMBULATORY_CARE_PROVIDER_SITE_OTHER): Payer: BLUE CROSS/BLUE SHIELD | Admitting: Family Medicine

## 2018-05-29 ENCOUNTER — Encounter: Payer: Self-pay | Admitting: Family Medicine

## 2018-05-29 VITALS — BP 122/53 | HR 57 | Ht 72.0 in | Wt 208.0 lb

## 2018-05-29 DIAGNOSIS — S86812A Strain of other muscle(s) and tendon(s) at lower leg level, left leg, initial encounter: Secondary | ICD-10-CM | POA: Diagnosis not present

## 2018-05-29 NOTE — Progress Notes (Signed)
Jay Schneider is a 53 y.o. male who presents to [facility] today for left calf pain.  Jay Schneider was in his normal state of health on March 1.  While running he felt a pulling sensation in his lateral left calf.  He immediately discontinued running and went home.  He had some pain with ambulation for a few days which has improved.  He notes continued pain with running but is able to do most normal activities without significant pain.  He denies significant swelling or bruising.  He had a similar incident involving his right calf several years ago that resulted in persistent cramping.  Despite extensive work-up in the past including exertional compartment syndrome testing his diagnosis was unclear.  He however had significant benefit with baclofen at bedtime.  He continues to take baclofen which significantly improves his nighttime cramping.  He notes this has not changed much over the last week and a half with his calf injury.    ROS:  As above  Exam:  BP (!) 122/53   Pulse (!) 57   Ht 6' (1.829 m)   Wt 208 lb (94.3 kg)   BMI 28.21 kg/m  Wt Readings from Last 5 Encounters:  05/29/18 208 lb (94.3 kg)  03/04/16 246 lb (111.6 kg)   General: Well Developed, well nourished, and in no acute distress.  Neuro/Psych: Alert and oriented x3, extra-ocular muscles intact, able to move all 4 extremities, sensation grossly intact. Skin: Warm and dry, no rashes noted.  Respiratory: Not using accessory muscles, speaking in full sentences, trachea midline.  Cardiovascular: Pulses palpable, no extremity edema. Abdomen: Does not appear distended. MSK:  Left calf normal-appearing with no swelling or bruising. Normal motion. Not particularly tender. Normal motion and strength. Area of previous pain was located at the lateral gastrocnemius musculotendinous junction. Normal gait.   Lab and Radiology Results Musculoskeletal left calf and  Achilles tendon.  Normal-appearing with no obvious tendon defects or muscle tear or area of hematoma. Plantaris tendon is not visualized.  Normal Bony structures.    Assessment and Plan: 53 y.o. male with left calf pain.  Patient describes what is very likely a small tear of his lateral gastrocnemius very likely at the muscle tendinous junction.  He is significantly improving but not fully resolved.  This is similar to an episode that happened previously in his right calf.  Plan to work on eccentric exercises and compression and return to activity as tolerated.  Reasonable to continue baclofen as needed for calf cramping.  Differential does also include plantaris tendon tear which the treatment for that would be the same and advance activity as tolerated.  Recheck as needed.   PDMP not reviewed this encounter. No orders of the defined types were placed in this encounter.  No orders of the defined types were placed in this encounter.   Historical information moved to improve visibility of documentation.  Past Medical History:  Diagnosis Date  . Connective tissue stenosis of neural canal of lumbar region 03/17/2014  . Gastroesophageal reflux disease without esophagitis 01/27/2016  . Hypothyroidism 03/04/2016   History reviewed. No pertinent surgical history. Social History   Tobacco Use  . Smoking status: Never Smoker  . Smokeless tobacco: Never Used  Substance Use Topics  . Alcohol use: Not on file   family history is not on file.  Medications: Current Outpatient Medications  Medication Sig Dispense Refill  . AMBULATORY NON FORMULARY MEDICATION Continuous positive airway pressure (  CPAP) machine auto-titrate from 4-20 cm of H2O pressure, with all supplemental supplies as needed. Please fax 7 day data to [phone] 1 each 0  . baclofen (LIORESAL) 20 MG tablet Take 0.5-1 tablets (10-20 mg total) by mouth at bedtime as needed for muscle spasms. 90 tablet 3  . buPROPion  (WELLBUTRIN XL) 150 MG 24 hr tablet Take 1 tablet (150 mg total) by mouth daily. 30 tablet 1  . dexmethylphenidate (FOCALIN XR) 20 MG 24 hr capsule Take 1 capsule (20 mg total) by mouth daily. 30 capsule 0  . lisdexamfetamine (VYVANSE) 60 MG capsule Take 1 capsule (60 mg total) by mouth every morning. 30 capsule 0  . lisdexamfetamine (VYVANSE) 60 MG capsule Take 1 capsule (60 mg total) by mouth every morning. 30 capsule 0  . methylphenidate (CONCERTA) 36 MG PO CR tablet Take 1 tablet (36 mg total) by mouth daily. 30 tablet 0  . methylphenidate (CONCERTA) 54 MG PO CR tablet Take 1 tablet (54 mg total) by mouth every morning. 30 tablet 0  . omeprazole (PRILOSEC) 10 MG capsule Take 10 mg by mouth daily.    Marland Kitchen PARoxetine (PAXIL) 10 MG tablet 2 per day 60 tablet 0  . sertraline (ZOLOFT) 100 MG tablet 1 a day for 1 week, then 1/2 /day for a week, then stop 11 tablet 0  . levothyroxine (SYNTHROID, LEVOTHROID) 50 MCG tablet Take by mouth.     No current facility-administered medications for this visit.    No Known Allergies    Discussed warning signs or symptoms. Please see discharge instructions. Patient expresses understanding.

## 2018-05-29 NOTE — Patient Instructions (Addendum)
Thank you for coming in today. I think this is a strain of the gastrocnemius muscle in the calf.  Consider compression.  Do the eccentric calf exercises especially for the first few weeks.  30 reps 3x daily Let me know if not improving.  Continue baclofen as needed.     Medial Head Gastrocnemius Tear Rehab Ask your health care provider which exercises are safe for you. Do exercises exactly as told by your health care provider and adjust them as directed. It is normal to feel mild stretching, pulling, tightness, or discomfort as you do these exercises, but you should stop right away if you feel sudden pain or your pain gets worse.Do not begin these exercises until told by your health care provider. Stretching and range of motion exercises These exercises warm up your muscles and joints and improve the movement and flexibility of your lower leg. These exercises also help to relieve pain and stiffness. Exercise A: Gastrocnemius stretch  1. Sit with your left / right leg extended. 2. Loop a belt or towel around the ball of your left / right foot. The ball of your foot is on the walking surface, right under your toes. 3. Hold both ends of the belt or towel. 4. Keep your left / right ankle and foot relaxed and keep your knee straight while you use the belt or towel to pull your foot and ankle toward you. Stop at the first point of resistance. 5. Hold this position for __________ seconds. Repeat __________ times. Complete this exercise __________ times a day. Exercise B: Ankle alphabet  1. Sit with your left / right leg supported at the lower leg. ? Do not rest your foot on anything. ? Make sure your foot has room to move freely. 2. Think of your left / right foot as a paintbrush, and move your foot to trace each letter of the alphabet in the air. Keep your hip and knee still while you trace. 3. Trace every letter from A to Z. Repeat __________ times. Complete this exercise __________ times a  day. Strengthening exercises These exercises build strength and endurance in your lower leg. Endurance is the ability to use your muscles for a long time, even after they get tired. Exercise C: Plantar flexors with band  1. Sit with your left / right leg extended. 2. Loop a rubber exercise band or tube around the ball of your left / right foot. The ball of your foot is on the walking surface, right under your toes. 3. While holding both ends of the band or tube, slowly point your toes downward, pushing them away from you. 4. Hold this position for __________ seconds. 5. Slowly return your foot to the starting position. Repeat __________ times. Complete this exercise __________ times a day. Exercise D: Plantar flexors, standing  1. Stand with your feet shoulder-width apart. 2. Place your hands on a wall or table to steady yourself as needed, but try not to use it very much for support. 3. Rise up on your toes. 4. If this exercise is too easy, try these options: ? Shift your weight toward your left / right leg until you feel challenged. ? If told by your health care provider, stand on your left / right foot only. 5. Hold this position for __________ seconds. Repeat __________ times. Complete this exercise __________ times a day. Exercise E: Plantar flexors, eccentric  1. Stand on the balls of your feet on the edge of a step. The ball  of your foot is on the walking surface, right under your toes. 2. Place your hands on a wall or railing for balance as needed, but try not to lean on it for support. 3. Rise up on your toes, using both legs to help. 4. Slowly shift all of your weight to your left / right foot and lift your other foot off the step. 5. Slowly lower your left / right heel so it drops below the level of the step. You will feel a slight stretch in your left / right calf. 6. Put your other foot back onto the step. Repeat __________ times. Complete this exercise __________ times a  day. This information is not intended to replace advice given to you by your health care provider. Make sure you discuss any questions you have with your health care provider. Document Released: 03/07/2005 Document Revised: 11/12/2015 Document Reviewed: 02/17/2015 Elsevier Interactive Patient Education  2019 [employer].

## 2018-06-01 DIAGNOSIS — F902 Attention-deficit hyperactivity disorder, combined type: Secondary | ICD-10-CM | POA: Diagnosis not present

## 2018-06-08 DIAGNOSIS — F902 Attention-deficit hyperactivity disorder, combined type: Secondary | ICD-10-CM | POA: Diagnosis not present

## 2018-06-15 DIAGNOSIS — F902 Attention-deficit hyperactivity disorder, combined type: Secondary | ICD-10-CM | POA: Diagnosis not present

## 2018-06-18 ENCOUNTER — Other Ambulatory Visit: Payer: Self-pay

## 2018-06-18 ENCOUNTER — Telehealth: Payer: Self-pay | Admitting: Psychiatry

## 2018-06-18 DIAGNOSIS — F331 Major depressive disorder, recurrent, moderate: Secondary | ICD-10-CM

## 2018-06-18 MED ORDER — PAROXETINE HCL 10 MG PO TABS: ORAL_TABLET | ORAL | 0 refills | Status: DC

## 2018-06-18 MED ORDER — DEXMETHYLPHENIDATE HCL ER 20 MG PO CP24: 20.0000 mg | ORAL_CAPSULE | Freq: Every day | ORAL | 0 refills | Status: DC

## 2018-06-18 MED ORDER — BUPROPION HCL ER (XL) 150 MG PO TB24: 150.0000 mg | ORAL_TABLET | Freq: Every day | ORAL | 1 refills | Status: DC

## 2018-06-18 NOTE — Telephone Encounter (Signed)
rx's submitted per request 

## 2018-06-18 NOTE — Progress Notes (Signed)
Refill request, pt rescheduled with provider being out of office. Refills submitted to pharmacy per request

## 2018-06-18 NOTE — Telephone Encounter (Signed)
We had reschedule his appt with Mat Carne.  IT is now scheduled for 07/19/18.  Please refill his Focalin, Paxil, Wellbutrin.  Send to [facility] pharmacy at [facility].

## 2018-06-20 ENCOUNTER — Ambulatory Visit: Payer: BLUE CROSS/BLUE SHIELD | Admitting: Psychiatry

## 2018-06-22 DIAGNOSIS — F902 Attention-deficit hyperactivity disorder, combined type: Secondary | ICD-10-CM | POA: Diagnosis not present

## 2018-06-25 ENCOUNTER — Other Ambulatory Visit: Payer: Self-pay | Admitting: Family Medicine

## 2018-07-03 DIAGNOSIS — F902 Attention-deficit hyperactivity disorder, combined type: Secondary | ICD-10-CM | POA: Diagnosis not present

## 2018-07-10 DIAGNOSIS — F902 Attention-deficit hyperactivity disorder, combined type: Secondary | ICD-10-CM | POA: Diagnosis not present

## 2018-07-17 DIAGNOSIS — F902 Attention-deficit hyperactivity disorder, combined type: Secondary | ICD-10-CM | POA: Diagnosis not present

## 2018-07-19 ENCOUNTER — Ambulatory Visit: Payer: BLUE CROSS/BLUE SHIELD | Admitting: Psychiatry

## 2018-07-20 ENCOUNTER — Encounter: Payer: Self-pay | Admitting: Physician Assistant

## 2018-07-20 ENCOUNTER — Ambulatory Visit: Payer: BLUE CROSS/BLUE SHIELD | Admitting: Physician Assistant

## 2018-07-20 ENCOUNTER — Other Ambulatory Visit: Payer: Self-pay

## 2018-07-20 ENCOUNTER — Telehealth: Payer: Self-pay | Admitting: Physician Assistant

## 2018-07-20 DIAGNOSIS — F331 Major depressive disorder, recurrent, moderate: Secondary | ICD-10-CM

## 2018-07-20 DIAGNOSIS — F902 Attention-deficit hyperactivity disorder, combined type: Secondary | ICD-10-CM | POA: Diagnosis not present

## 2018-07-20 MED ORDER — DEXMETHYLPHENIDATE HCL ER 20 MG PO CP24: 40.0000 mg | ORAL_CAPSULE | Freq: Every day | ORAL | 0 refills | Status: DC

## 2018-07-20 MED ORDER — BUPROPION HCL ER (XL) 150 MG PO TB24: 150.0000 mg | ORAL_TABLET | Freq: Every day | ORAL | 1 refills | Status: DC

## 2018-07-20 MED ORDER — DEXMETHYLPHENIDATE HCL ER 40 MG PO CP24: 40.0000 mg | ORAL_CAPSULE | ORAL | 0 refills | Status: DC

## 2018-07-20 MED ORDER — PAROXETINE HCL 10 MG PO TABS: ORAL_TABLET | ORAL | 1 refills | Status: DC

## 2018-07-20 NOTE — Progress Notes (Signed)
[facility]  Patient ID: Jay Schneider,  MRN: [id number]  PCP: Dorothey BasemanBronstein, David, MD  Date of Evaluation: 07/20/2018 Time spent:15 minutes  Chief Complaint:  Chief Complaint    ADHD; Anxiety     Virtual Visit via Telephone Note  I connected with patient by a video enabled telemedicine application or telephone, with their informed consent, and verified patient privacy and that I am speaking with the correct person using two identifiers.  I am private, in my home and the patient is home.   I discussed the limitations, risks, security and privacy concerns of performing an evaluation and management service by telephone and the availability of in person appointments. I also discussed with the patient that there may be a patient responsible charge related to this service. The patient expressed understanding and agreed to proceed.   I discussed the assessment and treatment plan with the patient. The patient was provided an opportunity to ask questions and all were answered. The patient agreed with the plan and demonstrated an understanding of the instructions.   The patient was advised to call back or seek an in-person evaluation if the symptoms worsen or if the condition fails to improve as anticipated.  I provided 15 minutes of non-face-to-face time during this encounter.  HISTORY/CURRENT STATUS: HPI for routine med check.  Patient started Focalin XR approximately 2 months ago.  He states he has had no improvement in focus or concentration since then.  It is about the same.  He gets easily distracted and has a hard time finishing tasks.  Patient denies loss of interest in usual activities and is able to enjoy things.  Denies decreased energy or motivation.  Appetite has not changed.  No extreme sadness, tearfulness, or feelings of hopelessness.  Denies any changes in concentration, making decisions or remembering things.  Denies suicidal or homicidal thoughts.   There are times when he is not sure if the Paxil and Wellbutrin are really helping that much but it is difficult to tell right now with the coronavirus pandemic and shelter in place orders.  He is on sabbatical from [employer] so he would be at home anyway.  However things have changed with his family, his children doing school online.  He sleeps pretty well most of the time.  Denies muscle or joint pain, stiffness, or dystonia.  Denies dizziness, syncope, seizures, numbness, tingling, tremor, tics, unsteady gait, slurred speech, confusion.   Individual Medical History/ Review of Systems: Changes? :No    Past medications for mental health diagnoses include: Zoloft, Wellbutrin XL, Vyvanse, Adderall, Buspar, Concerta, Strattera  Allergies: Patient has no known allergies.  Current Medications:  Current Outpatient Medications:  .  AMBULATORY NON FORMULARY MEDICATION, Continuous positive airway pressure (CPAP) machine auto-titrate from 4-20 cm of H2O pressure, with all supplemental supplies as needed. Please fax 7 day data to [phone], Disp: 1 each, Rfl: 0 .  baclofen (LIORESAL) 20 MG tablet, Take 0.5-1 tablets (10-20 mg total) by mouth at bedtime as needed for muscle spasms., Disp: 90 tablet, Rfl: 3 .  buPROPion (WELLBUTRIN XL) 150 MG 24 hr tablet, Take 1 tablet (150 mg total) by mouth daily., Disp: 30 tablet, Rfl: 1 .  levothyroxine (SYNTHROID) 88 MCG tablet, TAKE ONE TABLET BY MOUTH EVERY MORNING ON AN EMPTY STOMACH WITH A FULL GLASS OF WATER AT LEAST 30 TO 60 MINUTES BEFORE BREAKFAST, Disp: , Rfl:  .  omeprazole (PRILOSEC) 10 MG capsule, Take 10 mg by mouth daily., Disp: , Rfl:  .  PARoxetine (PAXIL) 10 MG tablet, 2 per day, Disp: 60 tablet, Rfl: 1 .  dexmethylphenidate (FOCALIN XR) 20 MG 24 hr capsule, Take 2 capsules (40 mg total) by mouth daily., Disp: 60 capsule, Rfl: 0 .  levothyroxine (SYNTHROID, LEVOTHROID) 50 MCG tablet, Take by mouth., Disp: , Rfl:  Medication Side Effects:  none  Family Medical/ Social History: Changes? Yes see HPI  MENTAL HEALTH EXAM:  There were no vitals taken for this visit.There is no height or weight on file to calculate BMI.  General Appearance: unable to assess  Eye Contact:  unable to assess  Speech:  Clear and Coherent  Volume:  Normal  Mood:  Euthymic  Affect:  unable to assess  Thought Process:  Goal Directed  Orientation:  Full (Time, Place, and Person)  Thought Content: Logical   Suicidal Thoughts:  No  Homicidal Thoughts:  No  Memory:  WNL  Judgement:  Good  Insight:  Good  Psychomotor Activity:  unable to assess  Concentration:  Concentration: Fair and Attention Span: Fair  Recall:  Good  Fund of Knowledge: Good  Language: Good  Assets:  Desire for Improvement  ADL's:  Intact  Cognition: WNL  Prognosis:  Good    DIAGNOSES:    ICD-10-CM   1. Attention deficit hyperactivity disorder (ADHD), combined type F90.2   2. Moderate episode of recurrent major depressive disorder (HCC) F33.1 buPROPion (WELLBUTRIN XL) 150 MG 24 hr tablet    Receiving Psychotherapy: No    RECOMMENDATIONS: Increase Focalin XR to a total of 40 mg p.o. every morning. Continue Wellbutrin XL 150 mg daily. Continue Paxil 10 mg daily. Return in 4 to 6 weeks.   Melony Overly, PA-C   This record has been created using [employer].  Chart creation errors have been sought, but may not always have been located and corrected. Such creation errors do not reflect on the standard of medical care.

## 2018-07-20 NOTE — Telephone Encounter (Signed)
Please call his pharmacy where I sent the Rx to today, and ask if they have Focalin XR 40mg .  If not, have them void that Rx, and let me know.  I'll send in for 20mg  (2) Thanks

## 2018-07-20 NOTE — Telephone Encounter (Signed)
The pharmacy is out of the Focalin XR 40 Mg but they do have 80 of the 20 Mg they can fill. Please send a new Rx and they will fill it. Thanks

## 2018-07-20 NOTE — Telephone Encounter (Signed)
Pharmacy currently closed for lunch, will call back

## 2018-07-20 NOTE — Telephone Encounter (Signed)
Patient stated Pharmacy does not have Focalin 40 mg., would like to know if you can resubmit for 20 mg., 2x a day

## 2018-07-25 DIAGNOSIS — F902 Attention-deficit hyperactivity disorder, combined type: Secondary | ICD-10-CM | POA: Diagnosis not present

## 2018-08-03 DIAGNOSIS — F902 Attention-deficit hyperactivity disorder, combined type: Secondary | ICD-10-CM | POA: Diagnosis not present

## 2018-08-06 ENCOUNTER — Telehealth: Payer: Self-pay | Admitting: Physician Assistant

## 2018-08-06 ENCOUNTER — Other Ambulatory Visit: Payer: Self-pay | Admitting: Physician Assistant

## 2018-08-06 NOTE — Telephone Encounter (Signed)
Pt. Verbalized understanding and will call if there are any complications coming off or weaning down.

## 2018-08-06 NOTE — Telephone Encounter (Signed)
Patient stated having issues with new stimulant medication he was prescribed.  Makes him jumpy and feel unsettled and nervous during the day.  He did not have the name of the medication stated you would know

## 2018-08-06 NOTE — Telephone Encounter (Signed)
I started Focalin XR 20mg  and he's supposed to take bid.  Is that how he's doing it?  Has he tried only 1 pill during the day and does it cause those sx too?  Did the other drugs like Vyvanse, Concerta, and Adderall give him the same side effects?  If he has not tried taking just one, have him do that.  If he has and it still a problem, let me know and I will need to switch to a different stimulant

## 2018-08-06 NOTE — Telephone Encounter (Signed)
Pt. States that he takes one 40 Mg Focalin once a day. He said he didn't feel it working on the 20 Mg BID so you switched him to the 40 Mg daily. I asked him about the other stimulants and he said he has had Sx from those as well. Please advise.

## 2018-08-06 NOTE — Telephone Encounter (Signed)
I think it's best to d/c it and we'll talk about other options (like Clonidine, Guanfacine, Strattera) at [facility] next week.  By stopping the Focalin cold [place] he may feel a little sluggish for the first day or 2.  If it is too much, have him call and I will send in a lower dose like 20 mg or 10 mg that he can take for a few days.  If he does have some at that leftover then he could take a 20 mg daily for 3 days and then stop or the same for 10 mg just weaned down over 3 to 5-day period of time.

## 2018-08-07 IMAGING — DX DG HAND COMPLETE 3+V*R*
3 series · 3 of 3 positions shown · non-contrast
Comparison: None.

CLINICAL DATA: Fifth metacarpal pain following karate injury,
initial encounter

EXAM:
RIGHT HAND - COMPLETE 3+ VIEW

[hand pa]
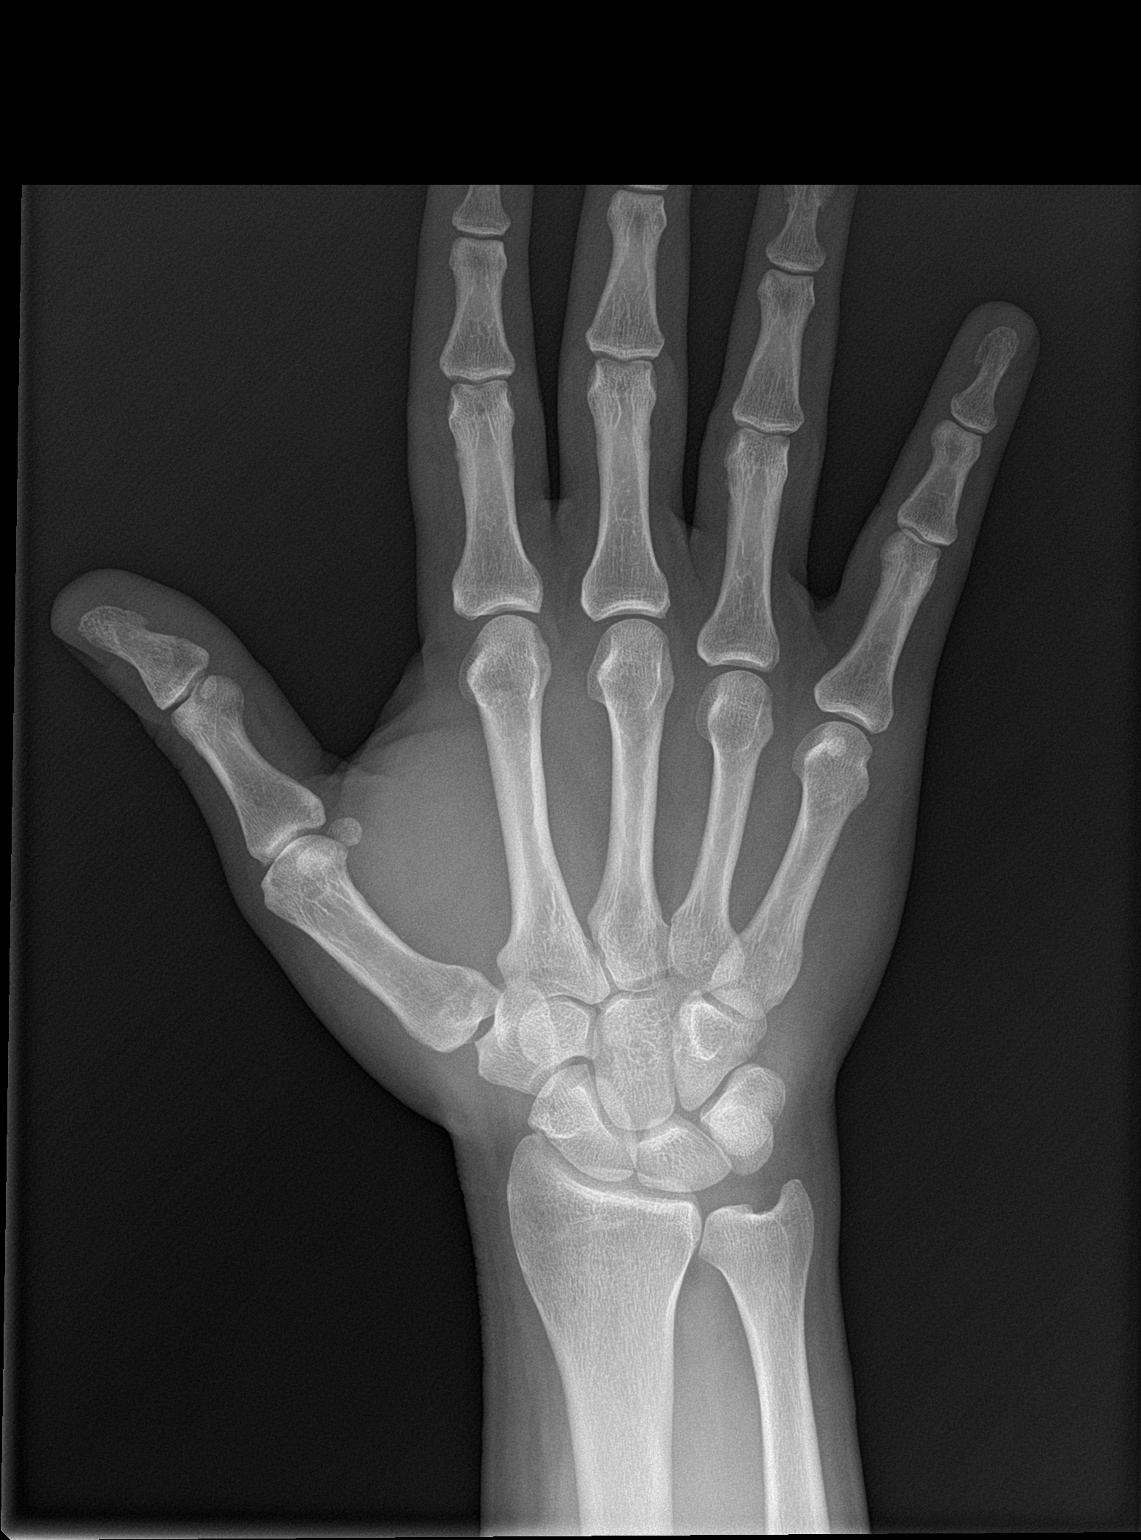

[hand obl]
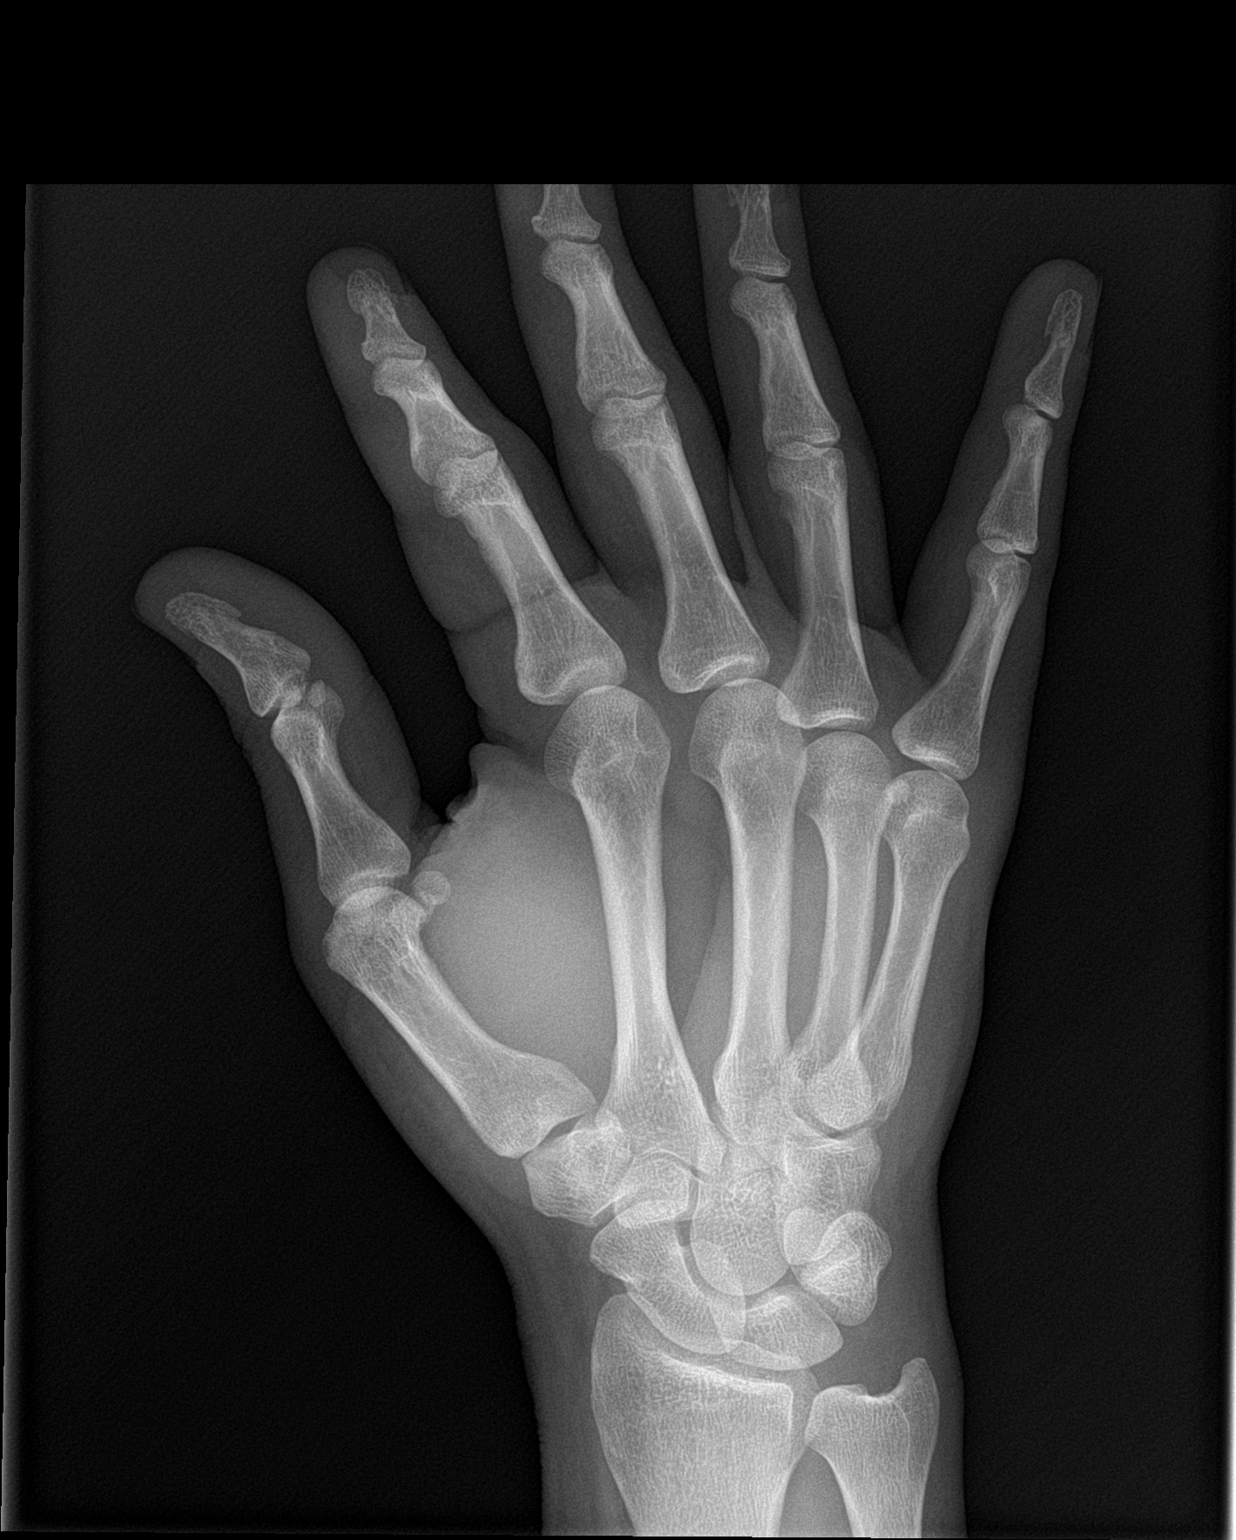

[hand lat]
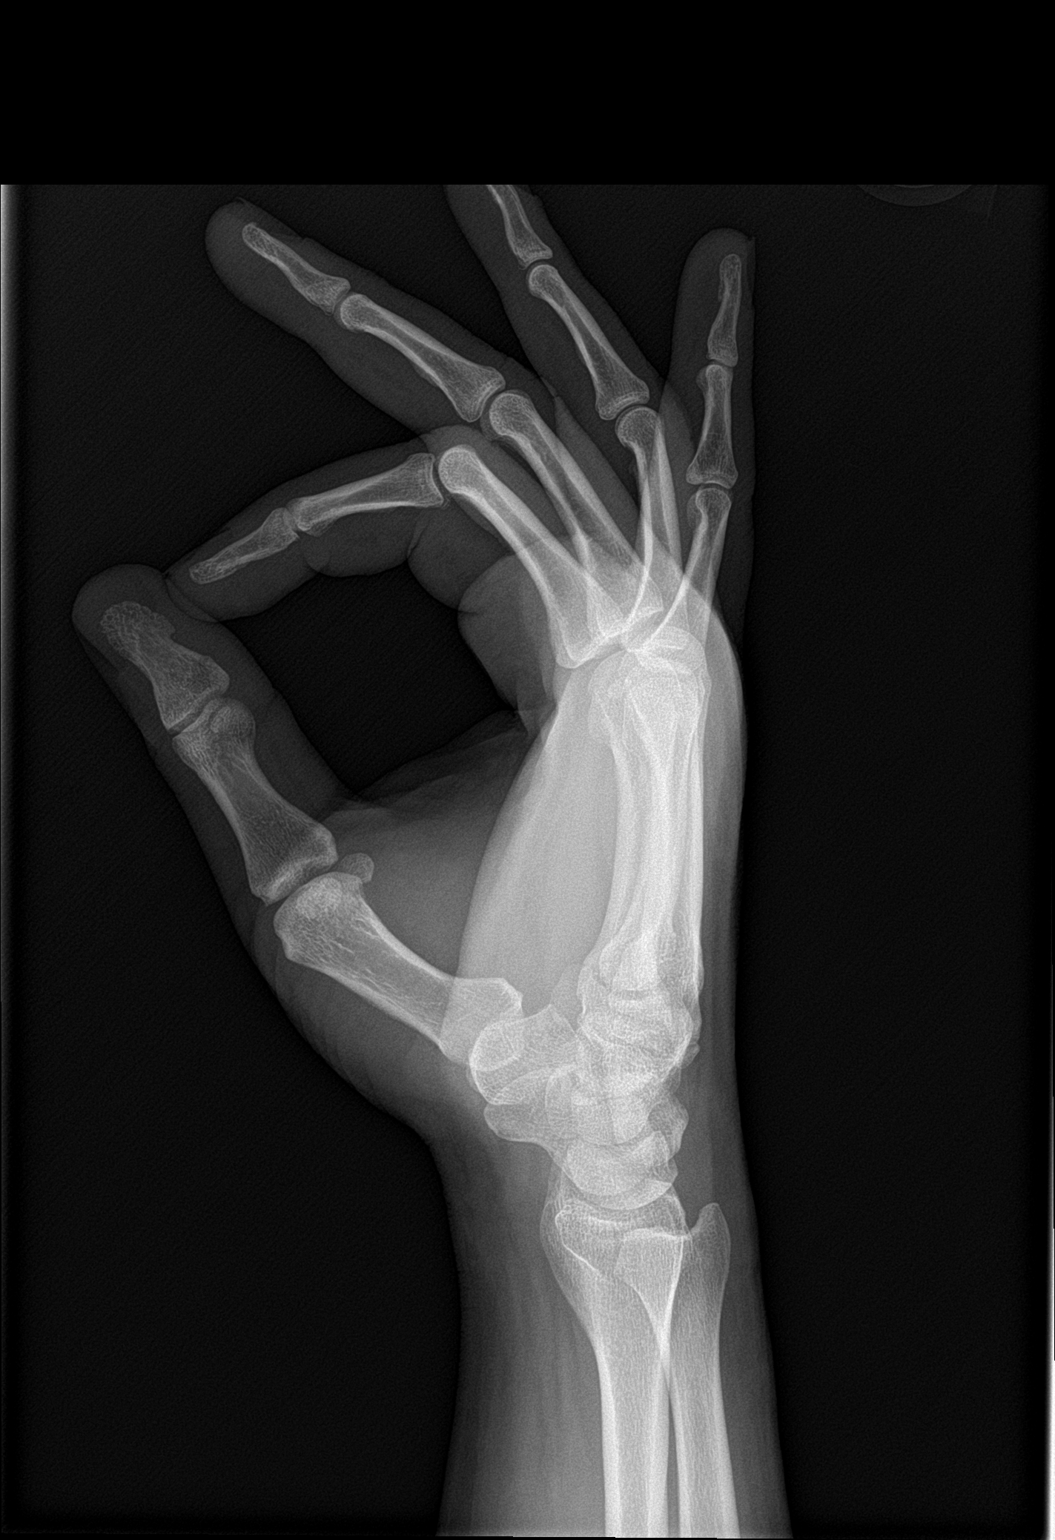

[3 of 3 positions shown; findings below may reference images not displayed]

FINDINGS: There is no evidence of fracture or dislocation. There is no
evidence of arthropathy or other focal bone abnormality. Soft
tissues are unremarkable.
IMPRESSION: No acute abnormality noted.

## 2018-08-10 DIAGNOSIS — F902 Attention-deficit hyperactivity disorder, combined type: Secondary | ICD-10-CM | POA: Diagnosis not present

## 2018-08-15 DIAGNOSIS — F902 Attention-deficit hyperactivity disorder, combined type: Secondary | ICD-10-CM | POA: Diagnosis not present

## 2018-08-17 ENCOUNTER — Other Ambulatory Visit: Payer: Self-pay

## 2018-08-17 ENCOUNTER — Ambulatory Visit (INDEPENDENT_AMBULATORY_CARE_PROVIDER_SITE_OTHER): Payer: BLUE CROSS/BLUE SHIELD | Admitting: Physician Assistant

## 2018-08-17 ENCOUNTER — Encounter: Payer: Self-pay | Admitting: Physician Assistant

## 2018-08-17 DIAGNOSIS — F331 Major depressive disorder, recurrent, moderate: Secondary | ICD-10-CM | POA: Diagnosis not present

## 2018-08-17 DIAGNOSIS — F902 Attention-deficit hyperactivity disorder, combined type: Secondary | ICD-10-CM | POA: Diagnosis not present

## 2018-08-17 NOTE — Progress Notes (Signed)
[facility]  Patient ID: Jay Schneider,  MRN: [id number]  PCP: Dorothey BasemanBronstein, David, MD  Date of Evaluation: 08/17/2018 Time spent:15 minutes  Chief Complaint:  Chief Complaint    ADHD; Depression     Virtual Visit via Telephone Note  I connected with patient by a video enabled telemedicine application or telephone, with their informed consent, and verified patient privacy and that I am speaking with the correct person using two identifiers.  I am private, in my home and the patient is home.  I discussed the limitations, risks, security and privacy concerns of performing an evaluation and management service by telephone and the availability of in person appointments. I also discussed with the patient that there may be a patient responsible charge related to this service. The patient expressed understanding and agreed to proceed.   I discussed the assessment and treatment plan with the patient. The patient was provided an opportunity to ask questions and all were answered. The patient agreed with the plan and demonstrated an understanding of the instructions.   The patient was advised to call back or seek an in-person evaluation if the symptoms worsen or if the condition fails to improve as anticipated.  I provided 15 minutes of non-face-to-face time during this encounter.  HISTORY/CURRENT STATUS: HPI For routine med check.  States the Focalin isn't helping.  He's ended up taking 1/2 of Focalin XR 40 mg b/c the whole pill made him jittery. Struggles w/ completing tasks, especially if he doesn't enjoy them, hard to stay on course, starts many different projects but doesn't finish them.  Takes him a long time to get started.  States nothing has really helped with the ADHD.  He has used Vyvanse but unsure what his maximum dose was.  He felt like it did not help.  The Adderall caused incontinence.  That was the only change he had made in medications or lifestyle at  that time and he had no other symptoms.  That resolved once he went off the Adderall.  He is taking Strattera and Concerta which were not effective.  Patient denies loss of interest in usual activities and is able to enjoy things.  Denies decreased energy or motivation.  Appetite has not changed.  No extreme sadness, tearfulness, or feelings of hopelessness.  Denies any changes in concentration, making decisions or remembering things.  Denies suicidal or homicidal thoughts.  Patient denies increased energy with decreased need for sleep, no increased talkativeness, no racing thoughts, no impulsivity or risky behaviors, no increased spending, no increased libido, no grandiosity.  Denies dizziness, syncope, seizures, numbness, tingling, tremor, tics, unsteady gait, slurred speech, confusion. Denies muscle or joint pain, stiffness, or dystonia.  Individual Medical History/ Review of Systems: Changes? :No    Past medications for mental health diagnoses include: Zoloft, Wellbutrin XL, Vyvanse and Adderall caused incontinence (he believes it is related to amphetamines) Buspar, Concerta, Strattera  Allergies: Patient has no known allergies.  Current Medications:  Current Outpatient Medications:  .  baclofen (LIORESAL) 20 MG tablet, Take 0.5-1 tablets (10-20 mg total) by mouth at bedtime as needed for muscle spasms., Disp: 90 tablet, Rfl: 3 .  buPROPion (WELLBUTRIN XL) 150 MG 24 hr tablet, Take 1 tablet (150 mg total) by mouth daily., Disp: 30 tablet, Rfl: 1 .  levothyroxine (SYNTHROID) 88 MCG tablet, TAKE ONE TABLET BY MOUTH EVERY MORNING ON AN EMPTY STOMACH WITH A FULL GLASS OF WATER AT LEAST 30 TO 60 MINUTES BEFORE BREAKFAST, Disp: ,  Rfl:  .  omeprazole (PRILOSEC) 10 MG capsule, Take 10 mg by mouth daily., Disp: , Rfl:  .  PARoxetine (PAXIL) 10 MG tablet, 2 per day, Disp: 60 tablet, Rfl: 1 .  AMBULATORY NON FORMULARY MEDICATION, Continuous positive airway pressure (CPAP) machine auto-titrate from  4-20 cm of H2O pressure, with all supplemental supplies as needed. Please fax 7 day data to [phone] (Patient not taking: Reported on 08/17/2018), Disp: 1 each, Rfl: 0 .  levothyroxine (SYNTHROID, LEVOTHROID) 50 MCG tablet, Take by mouth., Disp: , Rfl:  Medication Side Effects: none  Family Medical/ Social History: Changes? No  MENTAL HEALTH EXAM:  There were no vitals taken for this visit.There is no height or weight on file to calculate BMI.  General Appearance: unable to assess  Eye Contact:  unable to assess  Speech:  Clear and Coherent  Volume:  Normal  Mood:  Euthymic  Affect:  unable to assess  Thought Process:  Goal Directed  Orientation:  Full (Time, Place, and Person)  Thought Content: Logical   Suicidal Thoughts:  No  Homicidal Thoughts:  No  Memory:  WNL  Judgement:  Good  Insight:  Good  Psychomotor Activity:  unable to assess  Concentration:  Concentration: Fair and Attention Span: Fair  Recall:  Good  Fund of Knowledge: Good  Language: Good  Assets:  Desire for Improvement  ADL's:  Intact  Cognition: WNL  Prognosis:  Good    DIAGNOSES:    ICD-10-CM   1. Attention deficit hyperactivity disorder (ADHD), combined type F90.2   2. Moderate episode of recurrent major depressive disorder (HCC) F33.1     Receiving Psychotherapy: No    RECOMMENDATIONS:  DC Focalin because it is not helping. Continue Paxil 20 mg daily. Continue Wellbutrin XL 150 mg daily. We discussed the different stimulants.  He would be willing to retry the Vyvanse again if needed but there are many other newer stimulants that may be more effective without side effects.  Due to the coronavirus pandemic, I am not in the office today but will be on 08/20/2018.  I will get a co-pay card of Adhansia or jornay and he will come pick it up. Return in 4 to 6 weeks.  I would like for him to be on the stimulant for at least 4 weeks before he seen.   Melony Overly, PA-C   This record has been created  using [employer].  Chart creation errors have been sought, but may not always have been located and corrected. Such creation errors do not reflect on the standard of medical care.

## 2018-08-20 ENCOUNTER — Other Ambulatory Visit: Payer: Self-pay | Admitting: Physician Assistant

## 2018-08-20 DIAGNOSIS — F331 Major depressive disorder, recurrent, moderate: Secondary | ICD-10-CM

## 2018-08-20 MED ORDER — METHYLPHENIDATE HCL ER (PM) 20 MG PO CP24: 20.0000 mg | ORAL_CAPSULE | Freq: Every day | ORAL | 0 refills | Status: DC

## 2018-08-20 MED ORDER — PAROXETINE HCL 10 MG PO TABS: ORAL_TABLET | ORAL | 1 refills | Status: DC

## 2018-08-20 MED ORDER — BUPROPION HCL ER (XL) 150 MG PO TB24: 150.0000 mg | ORAL_TABLET | Freq: Every day | ORAL | 1 refills | Status: DC

## 2018-08-20 NOTE — Progress Notes (Signed)
Notified patient concerning options of Adhansia versus Jornay PM.  Because of sluggishness and difficulty getting out of bed in the mornings and being able to focus early morning, we decided to start the [place] PM.  I am leaving a coupon card upfront for him and he will pick it up tomorrow.  I have sent in a prescription for the Jornay PM to his pharmacy.

## 2018-08-28 DIAGNOSIS — E039 Hypothyroidism, unspecified: Secondary | ICD-10-CM | POA: Diagnosis not present

## 2018-08-29 DIAGNOSIS — F902 Attention-deficit hyperactivity disorder, combined type: Secondary | ICD-10-CM | POA: Diagnosis not present

## 2018-08-30 DIAGNOSIS — R5383 Other fatigue: Secondary | ICD-10-CM | POA: Diagnosis not present

## 2018-08-30 DIAGNOSIS — K219 Gastro-esophageal reflux disease without esophagitis: Secondary | ICD-10-CM | POA: Diagnosis not present

## 2018-08-30 DIAGNOSIS — Z125 Encounter for screening for malignant neoplasm of prostate: Secondary | ICD-10-CM | POA: Diagnosis not present

## 2018-09-04 DIAGNOSIS — F902 Attention-deficit hyperactivity disorder, combined type: Secondary | ICD-10-CM | POA: Diagnosis not present

## 2018-09-11 ENCOUNTER — Telehealth: Payer: Self-pay | Admitting: Physician Assistant

## 2018-09-11 ENCOUNTER — Other Ambulatory Visit: Payer: Self-pay

## 2018-09-11 DIAGNOSIS — F902 Attention-deficit hyperactivity disorder, combined type: Secondary | ICD-10-CM | POA: Diagnosis not present

## 2018-09-11 MED ORDER — JORNAY PM 20 MG PO CP24: 20.0000 mg | ORAL_CAPSULE | Freq: Every day | ORAL | 0 refills | Status: DC

## 2018-09-11 NOTE — Telephone Encounter (Signed)
Pt needs refill on Jornay sent to Kristopher Oppenheim at [facility].

## 2018-09-11 NOTE — Telephone Encounter (Signed)
Last fill 08/21/2018, will pend for approval.

## 2018-09-19 DIAGNOSIS — F902 Attention-deficit hyperactivity disorder, combined type: Secondary | ICD-10-CM | POA: Diagnosis not present

## 2018-10-05 DIAGNOSIS — F902 Attention-deficit hyperactivity disorder, combined type: Secondary | ICD-10-CM | POA: Diagnosis not present

## 2018-10-09 ENCOUNTER — Telehealth: Payer: Self-pay | Admitting: Physician Assistant

## 2018-10-09 ENCOUNTER — Ambulatory Visit: Payer: BLUE CROSS/BLUE SHIELD | Admitting: Physician Assistant

## 2018-10-09 ENCOUNTER — Other Ambulatory Visit: Payer: Self-pay

## 2018-10-09 NOTE — Telephone Encounter (Signed)
We called Jay Schneider to RS his appt today.  He is RS for 11/14/18.  He will refill of his jornay.  He does not feel that it is helping.  Could the dose be increased?  Call to let him know options.  Send script to Kristopher Oppenheim at [facility] center.

## 2018-10-10 ENCOUNTER — Other Ambulatory Visit: Payer: Self-pay | Admitting: Psychiatry

## 2018-10-10 MED ORDER — JORNAY PM 40 MG PO CP24: 40.0000 mg | ORAL_CAPSULE | Freq: Every evening | ORAL | 0 refills | Status: DC

## 2018-10-10 NOTE — Telephone Encounter (Signed)
Patient started on Jornay 20 mg each evening and noticed no effect.  We will increase the dosage to 40 mg each evening.  Prescription sent in.  Please inform patient

## 2018-10-10 NOTE — Telephone Encounter (Signed)
Pt. Made aware.

## 2018-10-10 NOTE — Progress Notes (Signed)
Patient started on Jornay 20 mg each evening and noticed no effect.  We will increase the dosage to 40 mg each evening

## 2018-10-12 DIAGNOSIS — F902 Attention-deficit hyperactivity disorder, combined type: Secondary | ICD-10-CM | POA: Diagnosis not present

## 2018-10-15 NOTE — Telephone Encounter (Signed)
noted 

## 2018-10-17 DIAGNOSIS — F902 Attention-deficit hyperactivity disorder, combined type: Secondary | ICD-10-CM | POA: Diagnosis not present

## 2018-11-07 DIAGNOSIS — F902 Attention-deficit hyperactivity disorder, combined type: Secondary | ICD-10-CM | POA: Diagnosis not present

## 2018-11-14 ENCOUNTER — Encounter: Payer: Self-pay | Admitting: Physician Assistant

## 2018-11-14 ENCOUNTER — Other Ambulatory Visit: Payer: Self-pay

## 2018-11-14 ENCOUNTER — Ambulatory Visit (INDEPENDENT_AMBULATORY_CARE_PROVIDER_SITE_OTHER): Payer: BC Managed Care – PPO | Admitting: Physician Assistant

## 2018-11-14 DIAGNOSIS — F331 Major depressive disorder, recurrent, moderate: Secondary | ICD-10-CM | POA: Diagnosis not present

## 2018-11-14 DIAGNOSIS — F902 Attention-deficit hyperactivity disorder, combined type: Secondary | ICD-10-CM | POA: Diagnosis not present

## 2018-11-14 DIAGNOSIS — F4010 Social phobia, unspecified: Secondary | ICD-10-CM

## 2018-11-14 MED ORDER — PAROXETINE HCL 30 MG PO TABS: 30.0000 mg | ORAL_TABLET | Freq: Every day | ORAL | 1 refills | Status: DC

## 2018-11-14 MED ORDER — JORNAY PM 80 MG PO CP24: 80.0000 mg | ORAL_CAPSULE | Freq: Every day | ORAL | 0 refills | Status: DC

## 2018-11-14 MED ORDER — BUPROPION HCL ER (XL) 150 MG PO TB24: 150.0000 mg | ORAL_TABLET | Freq: Every day | ORAL | 1 refills | Status: DC

## 2018-11-14 NOTE — Progress Notes (Signed)
[facility]  Patient ID: Jay Schneider,  MRN: [id number]  PCP: Juluis Pitch, MD  Date of Evaluation: 11/14/2018 Time spent:15 minutes  Chief Complaint:  Chief Complaint    ADHD; Depression; Follow-up; Anxiety     Virtual Visit via Telephone Note  I connected with patient by a video enabled telemedicine application or telephone, with their informed consent, and verified patient privacy and that I am speaking with the correct person using two identifiers.  I am private, in my home and the patient is home.   I discussed the limitations, risks, security and privacy concerns of performing an evaluation and management service by telephone and the availability of in person appointments. I also discussed with the patient that there may be a patient responsible charge related to this service. The patient expressed understanding and agreed to proceed.   I discussed the assessment and treatment plan with the patient. The patient was provided an opportunity to ask questions and all were answered. The patient agreed with the plan and demonstrated an understanding of the instructions.   The patient was advised to call back or seek an in-person evaluation if the symptoms worsen or if the condition fails to improve as anticipated.  I provided 15 minutes of non-face-to-face time during this encounter.  HISTORY/CURRENT STATUS: HPI for routine med check.  He has been on the [place] PM for 3 months now.  2 months at the lower dose and then 1 month at 40 mg.  He states he notices no difference whatsoever in the way he feels or his ability to focus and concentrate.  He has had no side effects from the medication.  He also feels mildly depressed.  He is not really enjoying things and energy and motivation are low although he does what he has to do.  States that some of it may be related to the coronavirus pandemic, working from home and stressors that that brings about but he  feels that the antidepressants are not really helping that much at this point.  He denies suicidal or homicidal thoughts.  He sleeps good most of the time.  He is not isolating any more than he has to due to the coronavirus pandemic.  Denies dizziness, syncope, seizures, numbness, tingling, tremor, tics, unsteady gait, slurred speech, confusion. Denies muscle or joint pain, stiffness, or dystonia.  Individual Medical History/ Review of Systems: Changes? :No    Past medications for mental health diagnoses include: Zoloft, Wellbutrin XL, Vyvanse and Adderall caused incontinence (he believes it is related to amphetamines) Buspar, Concerta, Strattera  Allergies: Patient has no known allergies.  Current Medications:  Current Outpatient Medications:  .  baclofen (LIORESAL) 20 MG tablet, Take 0.5-1 tablets (10-20 mg total) by mouth at bedtime as needed for muscle spasms., Disp: 90 tablet, Rfl: 3 .  buPROPion (WELLBUTRIN XL) 150 MG 24 hr tablet, Take 1 tablet (150 mg total) by mouth daily., Disp: 30 tablet, Rfl: 1 .  levothyroxine (SYNTHROID) 88 MCG tablet, TAKE ONE TABLET BY MOUTH EVERY MORNING ON AN EMPTY STOMACH WITH A FULL GLASS OF WATER AT LEAST 30 TO 60 MINUTES BEFORE BREAKFAST, Disp: , Rfl:  .  omeprazole (PRILOSEC) 10 MG capsule, Take 10 mg by mouth daily., Disp: , Rfl:  .  AMBULATORY NON FORMULARY MEDICATION, Continuous positive airway pressure (CPAP) machine auto-titrate from 4-20 cm of H2O pressure, with all supplemental supplies as needed. Please fax 7 day data to [phone] (Patient not taking: Reported on 08/17/2018), Disp: 1  each, Rfl: 0 .  levothyroxine (SYNTHROID, LEVOTHROID) 50 MCG tablet, Take by mouth., Disp: , Rfl:  .  Methylphenidate HCl ER, PM, (JORNAY PM) 80 MG CP24, Take 80 mg by mouth at bedtime., Disp: 30 capsule, Rfl: 0 .  PARoxetine (PAXIL) 30 MG tablet, Take 1 tablet (30 mg total) by mouth daily., Disp: 30 tablet, Rfl: 1 Medication Side Effects: none  Family Medical/  Social History: Changes? No  MENTAL HEALTH EXAM:  There were no vitals taken for this visit.There is no height or weight on file to calculate BMI.  General Appearance: Unable to assess  Eye Contact:  Unable to assess  Speech:  Clear and Coherent  Volume:  Normal  Mood:  Euthymic  Affect:  Unable to assess  Thought Process:  Goal Directed  Orientation:  Full (Time, Place, and Person)  Thought Content: Logical   Suicidal Thoughts:  No  Homicidal Thoughts:  No  Memory:  WNL  Judgement:  Good  Insight:  Good  Psychomotor Activity:  Unable to assess  Concentration:  Concentration: Fair and Attention Span: Fair  Recall:  Good  Fund of Knowledge: Good  Language: Good  Assets:  Desire for Improvement  ADL's:  Intact  Cognition: WNL  Prognosis:  Good    DIAGNOSES:    ICD-10-CM   1. Moderate episode of recurrent major depressive disorder (HCC)  F33.1 buPROPion (WELLBUTRIN XL) 150 MG 24 hr tablet  2. Attention deficit hyperactivity disorder (ADHD), combined type  F90.2   3. Social anxiety disorder  F40.10     Receiving Psychotherapy: No    RECOMMENDATIONS:  Since he has had no improvement at all we decided to increase the jornay as well as the Paxil.  He agrees.  We will increase the Jornay PM by 40 mg, not 20, so we can get a better idea if this is going to be effective or not.  If it is not effective at all at this dose, then we will change to a different drug at the next visit.  He understands and wants to do this. Increase Jornay PM to 80 mg p.o. nightly Increase Paxil to 30 mg daily. Continue Wellbutrin XL 150 mg daily. Return in 4 to 6 weeks.  Melony Overlyeresa Linsey Hirota, PA-C   This record has been created using [employer] software.  Chart creation errors have been sought, but may not always have been located and corrected. Such creation errors do not reflect on the standard of medical care.

## 2018-11-15 DIAGNOSIS — F902 Attention-deficit hyperactivity disorder, combined type: Secondary | ICD-10-CM | POA: Diagnosis not present

## 2018-11-23 DIAGNOSIS — F902 Attention-deficit hyperactivity disorder, combined type: Secondary | ICD-10-CM | POA: Diagnosis not present

## 2018-11-28 DIAGNOSIS — F902 Attention-deficit hyperactivity disorder, combined type: Secondary | ICD-10-CM | POA: Diagnosis not present

## 2018-11-30 ENCOUNTER — Telehealth: Payer: Self-pay | Admitting: Physician Assistant

## 2018-11-30 NOTE — Telephone Encounter (Signed)
Jay Schneider called to report that he increased his Jornay to 80mg .  Still does not any difference.  He is hoping there is another route that we can take.  Please call.  No appt set.

## 2018-12-03 NOTE — Telephone Encounter (Signed)
He needs to make appt.  He and I disc at last phone call that we would make changes at next OV if he didn't respond to the increased [place] PM.

## 2018-12-11 DIAGNOSIS — F902 Attention-deficit hyperactivity disorder, combined type: Secondary | ICD-10-CM | POA: Diagnosis not present

## 2018-12-20 DIAGNOSIS — F902 Attention-deficit hyperactivity disorder, combined type: Secondary | ICD-10-CM | POA: Diagnosis not present

## 2018-12-25 DIAGNOSIS — F902 Attention-deficit hyperactivity disorder, combined type: Secondary | ICD-10-CM | POA: Diagnosis not present

## 2019-01-02 ENCOUNTER — Ambulatory Visit: Payer: BC Managed Care – PPO | Admitting: Physician Assistant

## 2019-01-03 DIAGNOSIS — F902 Attention-deficit hyperactivity disorder, combined type: Secondary | ICD-10-CM | POA: Diagnosis not present

## 2019-01-04 ENCOUNTER — Other Ambulatory Visit: Payer: Self-pay

## 2019-01-04 ENCOUNTER — Ambulatory Visit: Payer: BC Managed Care – PPO | Admitting: Physician Assistant

## 2019-01-07 ENCOUNTER — Other Ambulatory Visit: Payer: Self-pay

## 2019-01-07 ENCOUNTER — Ambulatory Visit (INDEPENDENT_AMBULATORY_CARE_PROVIDER_SITE_OTHER): Payer: BC Managed Care – PPO | Admitting: Adult Health

## 2019-01-07 ENCOUNTER — Encounter: Payer: Self-pay | Admitting: Adult Health

## 2019-01-07 DIAGNOSIS — F331 Major depressive disorder, recurrent, moderate: Secondary | ICD-10-CM

## 2019-01-07 DIAGNOSIS — F4010 Social phobia, unspecified: Secondary | ICD-10-CM | POA: Diagnosis not present

## 2019-01-07 DIAGNOSIS — F902 Attention-deficit hyperactivity disorder, combined type: Secondary | ICD-10-CM

## 2019-01-07 MED ORDER — BUPROPION HCL ER (XL) 150 MG PO TB24: 150.0000 mg | ORAL_TABLET | Freq: Every day | ORAL | 1 refills | Status: DC

## 2019-01-07 MED ORDER — PAROXETINE HCL 30 MG PO TABS: 30.0000 mg | ORAL_TABLET | Freq: Every day | ORAL | 1 refills | Status: DC

## 2019-01-07 NOTE — Progress Notes (Signed)
Jay Schneider [id number] 1965/12/06 53 y.o.  Virtual Visit via Telephone Note  I connected with pt on 01/07/19 at  9:20 AM EDT by telephone and verified that I am speaking with the correct person using two identifiers.   I discussed the limitations, risks, security and privacy concerns of performing an evaluation and management service by telephone and the availability of in person appointments. I also discussed with the patient that there may be a patient responsible charge related to this service. The patient expressed understanding and agreed to proceed.   I discussed the assessment and treatment plan with the patient. The patient was provided an opportunity to ask questions and all were answered. The patient agreed with the plan and demonstrated an understanding of the instructions.   The patient was advised to call back or seek an in-person evaluation if the symptoms worsen or if the condition fails to improve as anticipated.  I provided 30 minutes of non-face-to-face time during this encounter.  The patient was located at home.  The provider was located at [place].   Aloha Gell, NP   Subjective:   Patient ID:  Jay Schneider is a 53 y.o. (DOB January 05, 1966) male.  Chief Complaint: No chief complaint on file.   HPI [name] presents for follow-up of ADHD, social anxiety, and MDD.  Describes mood today as "ok". Pleasant. Mood symptoms - reports depression, anxiety, and irritability. Stating "I'm seeing a therapist for that". Looking for "something to help with my ADHD" - "medications so far are not helping me"- diagnosed 16 months ago. Initially seen at [facility]. Stable interest and motivation. Taking medications as prescribed.  Energy levels stable. Active, has a regular exercise routine. Works full-time as a Network engineer at [employer].  Enjoys some usual interests and activities. Married. Spending time with family - wife  and children. Appetite adequate. Weight stable. Sleeps well most nights. Averages 6 hours. Focus and concentration difficulties. Completing tasks. Managing aspects of household. Difficulties in work setting. Stating "ADHD effects everything and makes my life balance and more difficult. Has to be hyper-focused to get things done and then everything else gets unattended.  Denies SI or HI. Denies AH or VH.  Past medications for mental health diagnoses include: Zoloft, Wellbutrin XL, Vyvanse and Adderall caused incontinence (he believes it is related to amphetamines) Buspar, Concerta, Strattera, Jornay  Allergies: Patient has no known allergies.  Review of Systems:  Review of Systems  Musculoskeletal: Negative for gait problem.  Neurological: Negative for tremors.  Psychiatric/Behavioral:       Please refer to HPI    Medications: I have reviewed the patient's current medications.  Current Outpatient Medications  Medication Sig Dispense Refill  . AMBULATORY NON FORMULARY MEDICATION Continuous positive airway pressure (CPAP) machine auto-titrate from 4-20 cm of H2O pressure, with all supplemental supplies as needed. Please fax 7 day data to [phone] (Patient not taking: Reported on 08/17/2018) 1 each 0  . baclofen (LIORESAL) 20 MG tablet Take 0.5-1 tablets (10-20 mg total) by mouth at bedtime as needed for muscle spasms. 90 tablet 3  . buPROPion (WELLBUTRIN XL) 150 MG 24 hr tablet Take 1 tablet (150 mg total) by mouth daily. 30 tablet 1  . levothyroxine (SYNTHROID) 88 MCG tablet TAKE ONE TABLET BY MOUTH EVERY MORNING ON AN EMPTY STOMACH WITH A FULL GLASS OF WATER AT LEAST 30 TO 60 MINUTES BEFORE BREAKFAST    . levothyroxine (SYNTHROID, LEVOTHROID) 50 MCG tablet Take by mouth.    Marland Kitchen  Methylphenidate HCl ER, PM, (JORNAY PM) 80 MG CP24 Take 80 mg by mouth at bedtime. 30 capsule 0  . omeprazole (PRILOSEC) 10 MG capsule Take 10 mg by mouth daily.    Marland Kitchen. PARoxetine (PAXIL) 30 MG tablet Take 1  tablet (30 mg total) by mouth daily. 30 tablet 1   No current facility-administered medications for this visit.     Medication Side Effects: None  Allergies: No Known Allergies  Past Medical History:  Diagnosis Date  . Connective tissue stenosis of neural canal of lumbar region 03/17/2014  . Gastroesophageal reflux disease without esophagitis 01/27/2016  . Hypothyroidism 03/04/2016    No family history on file.  Social History   Socioeconomic History  . Marital status: Married    Spouse name: Not on file  . Number of children: Not on file  . Years of education: Not on file  . Highest education level: Not on file  Occupational History  . Not on file  Social Needs  . Financial resource strain: Not on file  . Food insecurity    Worry: Not on file    Inability: Not on file  . Transportation needs    Medical: Not on file    Non-medical: Not on file  Tobacco Use  . Smoking status: Never Smoker  . Smokeless tobacco: Never Used  Substance and Sexual Activity  . Alcohol use: Not Currently    Alcohol/week: 0.0 - 1.0 standard drinks  . Drug use: Not Currently  . Sexual activity: Not on file  Lifestyle  . Physical activity    Days per week: Not on file    Minutes per session: Not on file  . Stress: Not on file  Relationships  . Social Musicianconnections    Talks on phone: Not on file    Gets together: Not on file    Attends religious service: Not on file    Active member of club or organization: Not on file    Attends meetings of clubs or organizations: Not on file    Relationship status: Not on file  . Intimate partner violence    Fear of current or ex partner: Not on file    Emotionally abused: Not on file    Physically abused: Not on file    Forced sexual activity: Not on file  Other Topics Concern  . Not on file  Social History Narrative  . Not on file    Past Medical History, Surgical history, Social history, and Family history were reviewed and updated as  appropriate.   Please see review of systems for further details on the patient's review from today.   Objective:   Physical Exam:  There were no vitals taken for this visit.  Physical Exam Constitutional:      General: He is not in acute distress.    Appearance: He is well-developed.  Musculoskeletal:        General: No deformity.  Neurological:     Mental Status: He is alert and oriented to person, place, and time.     Coordination: Coordination normal.  Psychiatric:        Attention and Perception: Attention and perception normal. He does not perceive auditory or visual hallucinations.        Mood and Affect: Mood is anxious and depressed. Affect is not labile, blunt, angry or inappropriate.        Speech: Speech normal.        Behavior: Behavior normal.  Thought Content: Thought content normal. Thought content is not paranoid or delusional. Thought content does not include homicidal or suicidal ideation. Thought content does not include homicidal or suicidal plan.        Cognition and Memory: Cognition and memory normal.        Judgment: Judgment normal.     Comments: Insight intact     Lab Review:     Component Value Date/Time   NA 144 10/03/2017 0833   K 4.4 10/03/2017 0833   CL 105 10/03/2017 0833   CO2 24 10/03/2017 0833   GLUCOSE 91 10/03/2017 0833   BUN 14 10/03/2017 0833   CREATININE 0.86 10/03/2017 0833   CALCIUM 9.5 10/03/2017 0833   PROT 6.7 10/03/2017 0833   ALBUMIN 4.7 10/03/2017 0833   AST 29 10/03/2017 0833   ALT 23 10/03/2017 0833   ALKPHOS 63 10/03/2017 0833   BILITOT 0.4 10/03/2017 0833   GFRNONAA 101 10/03/2017 0833   GFRAA 117 10/03/2017 0833       Component Value Date/Time   WBC 3.9 10/03/2017 0833   RBC 4.78 10/03/2017 0833   HGB 13.2 10/03/2017 0833   HCT 40.7 10/03/2017 0833   PLT 127 (L) 10/03/2017 0833   MCV 85 10/03/2017 0833   MCH 27.6 10/03/2017 0833   MCHC 32.4 10/03/2017 0833   RDW 14.4 10/03/2017 0833   LYMPHSABS  1.1 10/03/2017 0833   EOSABS 0.1 10/03/2017 0833   BASOSABS 0.0 10/03/2017 0833    No results found for: POCLITH, LITHIUM   No results found for: PHENYTOIN, PHENOBARB, VALPROATE, CBMZ   .res Assessment: Plan:    RECOMMENDATIONS:  Stop Jornay PM 80mg  - ineffective.   Continue Paxil to 30 mg daily. Continue Wellbutrin XL 150 mg daily - has taken an increased dose previously without effectiveness  Can trial Modafinil 100mg  in the morning or Aricept 5mg  daily - off label use for ADHD. Will call and discuss with patient.   Return in 4 to 6 weeks   Diagnoses and all orders for this visit:  Moderate episode of recurrent major depressive disorder (HCC) -     PARoxetine (PAXIL) 30 MG tablet; Take 1 tablet (30 mg total) by mouth daily. -     buPROPion (WELLBUTRIN XL) 150 MG 24 hr tablet; Take 1 tablet (150 mg total) by mouth daily.  Attention deficit hyperactivity disorder (ADHD), combined type -     buPROPion (WELLBUTRIN XL) 150 MG 24 hr tablet; Take 1 tablet (150 mg total) by mouth daily.  Social anxiety disorder -     PARoxetine (PAXIL) 30 MG tablet; Take 1 tablet (30 mg total) by mouth daily.    Please see After Visit Summary for patient specific instructions.  No future appointments.  No orders of the defined types were placed in this encounter.     -------------------------------

## 2019-01-16 ENCOUNTER — Telehealth: Payer: Self-pay | Admitting: Adult Health

## 2019-01-16 NOTE — Telephone Encounter (Signed)
Patient called and said that he is waiting to hear back from gina  About what options she had for him. He said she was going to talk to teresa and then get back to him about medicine to try and he has not heard anything and was checking on the status. Please give him a call at [phone]

## 2019-01-17 ENCOUNTER — Other Ambulatory Visit: Payer: Self-pay

## 2019-01-17 MED ORDER — MODAFINIL 100 MG PO TABS: ORAL_TABLET | ORAL | 0 refills | Status: DC

## 2019-01-17 NOTE — Telephone Encounter (Signed)
Noted  

## 2019-01-17 NOTE — Telephone Encounter (Signed)
I left a note for [place] to call him - I talked to Dr Clovis Pu extensively about this case - we can add provigil 100mg  daily - does not tolerate stimulants.  Would you call and advise him that we can call this in if interested.

## 2019-01-17 NOTE — Telephone Encounter (Signed)
Thanks will submit PA

## 2019-01-17 NOTE — Telephone Encounter (Signed)
I doubt it - he has tried all kinds of stimulants and non-stimulant treatments.

## 2019-01-17 NOTE — Telephone Encounter (Signed)
Ty!

## 2019-01-21 NOTE — Telephone Encounter (Signed)
yes

## 2019-01-21 NOTE — Telephone Encounter (Signed)
Looks like patient picked up modafinil on 01/17/2019 already.

## 2019-01-23 DIAGNOSIS — F902 Attention-deficit hyperactivity disorder, combined type: Secondary | ICD-10-CM | POA: Diagnosis not present

## 2019-01-29 DIAGNOSIS — F902 Attention-deficit hyperactivity disorder, combined type: Secondary | ICD-10-CM | POA: Diagnosis not present

## 2019-02-05 DIAGNOSIS — F902 Attention-deficit hyperactivity disorder, combined type: Secondary | ICD-10-CM | POA: Diagnosis not present

## 2019-02-11 DIAGNOSIS — F902 Attention-deficit hyperactivity disorder, combined type: Secondary | ICD-10-CM | POA: Diagnosis not present

## 2019-02-13 ENCOUNTER — Other Ambulatory Visit: Payer: Self-pay | Admitting: Adult Health

## 2019-02-19 ENCOUNTER — Ambulatory Visit: Payer: BC Managed Care – PPO | Admitting: Physician Assistant

## 2019-02-20 DIAGNOSIS — F902 Attention-deficit hyperactivity disorder, combined type: Secondary | ICD-10-CM | POA: Diagnosis not present

## 2019-02-27 DIAGNOSIS — F902 Attention-deficit hyperactivity disorder, combined type: Secondary | ICD-10-CM | POA: Diagnosis not present

## 2019-03-07 DIAGNOSIS — F902 Attention-deficit hyperactivity disorder, combined type: Secondary | ICD-10-CM | POA: Diagnosis not present

## 2019-03-11 DIAGNOSIS — F902 Attention-deficit hyperactivity disorder, combined type: Secondary | ICD-10-CM | POA: Diagnosis not present

## 2019-03-19 ENCOUNTER — Other Ambulatory Visit: Payer: Self-pay

## 2019-03-19 ENCOUNTER — Telehealth: Payer: Self-pay | Admitting: Physician Assistant

## 2019-03-19 DIAGNOSIS — F4010 Social phobia, unspecified: Secondary | ICD-10-CM

## 2019-03-19 DIAGNOSIS — F902 Attention-deficit hyperactivity disorder, combined type: Secondary | ICD-10-CM

## 2019-03-19 DIAGNOSIS — F331 Major depressive disorder, recurrent, moderate: Secondary | ICD-10-CM

## 2019-03-19 MED ORDER — BUPROPION HCL ER (XL) 150 MG PO TB24: 150.0000 mg | ORAL_TABLET | Freq: Every day | ORAL | 0 refills | Status: DC

## 2019-03-19 MED ORDER — PAROXETINE HCL 30 MG PO TABS: 30.0000 mg | ORAL_TABLET | Freq: Every day | ORAL | 0 refills | Status: DC

## 2019-03-19 NOTE — Telephone Encounter (Signed)
Pt needs a refill on Bupropion 150mg , and Paroxetine 30mg . Please send to [facility].

## 2019-03-19 NOTE — Telephone Encounter (Signed)
Refills submitted.  

## 2019-03-21 ENCOUNTER — Encounter: Payer: Self-pay | Admitting: Physician Assistant

## 2019-03-21 ENCOUNTER — Ambulatory Visit (INDEPENDENT_AMBULATORY_CARE_PROVIDER_SITE_OTHER): Payer: BC Managed Care – PPO | Admitting: Physician Assistant

## 2019-03-21 DIAGNOSIS — F4010 Social phobia, unspecified: Secondary | ICD-10-CM | POA: Diagnosis not present

## 2019-03-21 DIAGNOSIS — F902 Attention-deficit hyperactivity disorder, combined type: Secondary | ICD-10-CM | POA: Diagnosis not present

## 2019-03-21 DIAGNOSIS — F331 Major depressive disorder, recurrent, moderate: Secondary | ICD-10-CM

## 2019-03-21 MED ORDER — GUANFACINE HCL ER 1 MG PO TB24: 1.0000 mg | ORAL_TABLET | Freq: Every day | ORAL | 1 refills | Status: DC

## 2019-03-21 NOTE — Progress Notes (Signed)
[facility]  Patient ID: Jay Schneider,  MRN: [id number]  PCP: Juluis Pitch, MD  Date of Evaluation: 03/21/2019 Time spent:15 minutes  Chief Complaint:  Chief Complaint    Anxiety; Depression; ADD; Follow-up     Virtual Visit via Telephone Note  I connected with patient by a video enabled telemedicine application or telephone, with their informed consent, and verified patient privacy and that I am speaking with the correct person using two identifiers.  I am private, in my office and the patient is home.  I discussed the limitations, risks, security and privacy concerns of performing an evaluation and management service by telephone and the availability of in person appointments. I also discussed with the patient that there may be a patient responsible charge related to this service. The patient expressed understanding and agreed to proceed.   I discussed the assessment and treatment plan with the patient. The patient was provided an opportunity to ask questions and all were answered. The patient agreed with the plan and demonstrated an understanding of the instructions.   The patient was advised to call back or seek an in-person evaluation if the symptoms worsen or if the condition fails to improve as anticipated.  I provided 15 minutes of non-face-to-face time during this encounter.  HISTORY/CURRENT STATUS: HPI For routine med check.   His last appointment was 2 months ago with Deloria Lair, NP, in my absence.  [place] Alar PM was discontinued because it was not effective at all.  He was started on modafinil.  Patient states it has not helped at all either.  The ADD/ADHD is still a problem, he has a hard time focusing and being able to complete tasks.  His symptoms have been difficult to treat.  He is tried many different stimulants and only had some slight improvement with Adderall for a few weeks after starting it.  Unfortunately he had incontinence  with that and stopped the medicine.  He continues to have some situational moodiness.  He does feel "down" depending on his circumstances.  Energy and motivation is good.  Work is going well.  He sleeps fine most of the time.  Denies suicidal or homicidal thoughts.  Patient denies increased energy with decreased need for sleep, no increased talkativeness, no racing thoughts, no impulsivity or risky behaviors, no increased spending, no increased libido, no grandiosity.  Denies dizziness, syncope, seizures, numbness, tingling, tremor, tics, unsteady gait, slurred speech, confusion. Denies muscle or joint pain, stiffness, or dystonia.  Individual Medical History/ Review of Systems: Changes? :No    Past medications for mental health diagnoses include: Zoloft, Wellbutrin XL, VyvanseandAdderallcaused incontinence (he believes it is related to amphetamines)Buspar, Concerta, Strattera, Jornay PM was ineffective, modafinil ineffective at low dose.  Allergies: Patient has no known allergies.  Current Medications:  Current Outpatient Medications:  .  baclofen (LIORESAL) 20 MG tablet, Take 0.5-1 tablets (10-20 mg total) by mouth at bedtime as needed for muscle spasms., Disp: 90 tablet, Rfl: 3 .  buPROPion (WELLBUTRIN XL) 150 MG 24 hr tablet, Take 1 tablet (150 mg total) by mouth daily., Disp: 30 tablet, Rfl: 0 .  levothyroxine (SYNTHROID) 88 MCG tablet, TAKE ONE TABLET BY MOUTH EVERY MORNING ON AN EMPTY STOMACH WITH A FULL GLASS OF WATER AT LEAST 30 TO 60 MINUTES BEFORE BREAKFAST, Disp: , Rfl:  .  omeprazole (PRILOSEC) 10 MG capsule, Take 10 mg by mouth daily., Disp: , Rfl:  .  AMBULATORY NON FORMULARY MEDICATION, Continuous positive airway pressure (CPAP) machine  auto-titrate from 4-20 cm of H2O pressure, with all supplemental supplies as needed. Please fax 7 day data to [phone] (Patient not taking: Reported on 08/17/2018), Disp: 1 each, Rfl: 0 .  guanFACINE (INTUNIV) 1 MG TB24 ER tablet, Take 1  tablet (1 mg total) by mouth daily., Disp: 30 tablet, Rfl: 1 .  levothyroxine (SYNTHROID, LEVOTHROID) 50 MCG tablet, Take by mouth., Disp: , Rfl:  .  PARoxetine (PAXIL) 30 MG tablet, Take 1 tablet (30 mg total) by mouth daily., Disp: 30 tablet, Rfl: 0 Medication Side Effects: none  Family Medical/ Social History: Changes? No  MENTAL HEALTH EXAM:  There were no vitals taken for this visit.There is no height or weight on file to calculate BMI.  General Appearance: unable to assess  Eye Contact:  Unable to assess  Speech:  Clear and Coherent  Volume:  Normal  Mood:  Euthymic  Affect:  Unable to assess  Thought Process:  Goal Directed and Descriptions of Associations: Intact  Orientation:  Full (Time, Place, and Person)  Thought Content: Logical   Suicidal Thoughts:  No  Homicidal Thoughts:  No  Memory:  WNL  Judgement:  Good  Insight:  Good  Psychomotor Activity:  Unable to assess  Concentration:  Concentration: Fair and Attention Span: Fair  Recall:  Good  Fund of Knowledge: Good  Language: Good  Assets:  Desire for Improvement  ADL's:  Intact  Cognition: WNL  Prognosis:  Good    DIAGNOSES:    ICD-10-CM   1. Attention deficit hyperactivity disorder (ADHD), combined type  F90.2   2. Moderate episode of recurrent major depressive disorder (HCC)  F33.1   3. Social anxiety disorder  F40.10     Receiving Psychotherapy: Yes    RECOMMENDATIONS:  Patient states he has never had problems with low blood pressure.  We discussed Intuniv versus Kapvay.  We need to try a different class of drugs to help with the ADD.  I discussed the side effects, possible dizziness and hypotension.  He knows to call if there are problems. Start Intuniv 1 mg daily. Continue Wellbutrin XL 150 mg daily. Continue Paxil 30 mg daily. Discontinue modafinil. Continue psychotherapy. Return in 4 weeks.  Melony Overly, PA-C

## 2019-03-26 DIAGNOSIS — F902 Attention-deficit hyperactivity disorder, combined type: Secondary | ICD-10-CM | POA: Diagnosis not present

## 2019-04-03 DIAGNOSIS — F902 Attention-deficit hyperactivity disorder, combined type: Secondary | ICD-10-CM | POA: Diagnosis not present

## 2019-04-11 DIAGNOSIS — F902 Attention-deficit hyperactivity disorder, combined type: Secondary | ICD-10-CM | POA: Diagnosis not present

## 2019-04-18 ENCOUNTER — Other Ambulatory Visit: Payer: Self-pay

## 2019-04-18 ENCOUNTER — Telehealth: Payer: Self-pay | Admitting: Physician Assistant

## 2019-04-18 DIAGNOSIS — F902 Attention-deficit hyperactivity disorder, combined type: Secondary | ICD-10-CM

## 2019-04-18 DIAGNOSIS — F4010 Social phobia, unspecified: Secondary | ICD-10-CM

## 2019-04-18 DIAGNOSIS — F331 Major depressive disorder, recurrent, moderate: Secondary | ICD-10-CM

## 2019-04-18 MED ORDER — PAROXETINE HCL 30 MG PO TABS: 30.0000 mg | ORAL_TABLET | Freq: Every day | ORAL | 0 refills | Status: DC

## 2019-04-18 MED ORDER — GUANFACINE HCL ER 1 MG PO TB24: 1.0000 mg | ORAL_TABLET | Freq: Every day | ORAL | 0 refills | Status: DC

## 2019-04-18 MED ORDER — BUPROPION HCL ER (XL) 150 MG PO TB24: 150.0000 mg | ORAL_TABLET | Freq: Every day | ORAL | 0 refills | Status: DC

## 2019-04-18 NOTE — Telephone Encounter (Signed)
Patient should have 1 refill on file for all 3 medications, but will resubmit for 1 month refills.

## 2019-04-18 NOTE — Telephone Encounter (Signed)
Pt called to request refill on all 3 meds @ [facility]  Wellbutrin, Intuniv & Paxil all generic.  Next appt 2/17

## 2019-04-19 DIAGNOSIS — F902 Attention-deficit hyperactivity disorder, combined type: Secondary | ICD-10-CM | POA: Diagnosis not present

## 2019-04-26 DIAGNOSIS — F902 Attention-deficit hyperactivity disorder, combined type: Secondary | ICD-10-CM | POA: Diagnosis not present

## 2019-05-01 DIAGNOSIS — F902 Attention-deficit hyperactivity disorder, combined type: Secondary | ICD-10-CM | POA: Diagnosis not present

## 2019-05-06 DIAGNOSIS — F902 Attention-deficit hyperactivity disorder, combined type: Secondary | ICD-10-CM | POA: Diagnosis not present

## 2019-05-08 ENCOUNTER — Ambulatory Visit (INDEPENDENT_AMBULATORY_CARE_PROVIDER_SITE_OTHER): Payer: BC Managed Care – PPO | Admitting: Physician Assistant

## 2019-05-08 ENCOUNTER — Encounter: Payer: Self-pay | Admitting: Physician Assistant

## 2019-05-08 DIAGNOSIS — F331 Major depressive disorder, recurrent, moderate: Secondary | ICD-10-CM

## 2019-05-08 DIAGNOSIS — F902 Attention-deficit hyperactivity disorder, combined type: Secondary | ICD-10-CM

## 2019-05-08 DIAGNOSIS — F4010 Social phobia, unspecified: Secondary | ICD-10-CM

## 2019-05-08 MED ORDER — BUPROPION HCL ER (XL) 150 MG PO TB24: 150.0000 mg | ORAL_TABLET | Freq: Every day | ORAL | 0 refills | Status: DC

## 2019-05-08 MED ORDER — PAROXETINE HCL 30 MG PO TABS: 30.0000 mg | ORAL_TABLET | Freq: Every day | ORAL | 0 refills | Status: DC

## 2019-05-08 MED ORDER — GUANFACINE HCL ER 2 MG PO TB24: 2.0000 mg | ORAL_TABLET | Freq: Every day | ORAL | 1 refills | Status: DC

## 2019-05-08 NOTE — Progress Notes (Signed)
[facility]  Patient ID: Jay Schneider,  MRN: [id number]  PCP: Dorothey Baseman, MD  Date of Evaluation: 05/08/2019 Time spent:20 minutes  Chief Complaint:  Chief Complaint    ADD; Depression; Follow-up     Virtual Visit via Telephone Note  I connected with patient by a video enabled telemedicine application or telephone, with their informed consent, and verified patient privacy and that I am speaking with the correct person using two identifiers.  I am private, in my office and the patient is home.  I discussed the limitations, risks, security and privacy concerns of performing an evaluation and management service by telephone and the availability of in person appointments. I also discussed with the patient that there may be a patient responsible charge related to this service. The patient expressed understanding and agreed to proceed.   I discussed the assessment and treatment plan with the patient. The patient was provided an opportunity to ask questions and all were answered. The patient agreed with the plan and demonstrated an understanding of the instructions.   The patient was advised to call back or seek an in-person evaluation if the symptoms worsen or if the condition fails to improve as anticipated.  I provided 20 minutes of non-face-to-face time during this encounter.  HISTORY/CURRENT STATUS: HPI For routine f/u.   Feels better.  Thinks it is related to successes and therapy, meditation, and yoga, rather than the medications.  States it could be the combination however.  He does have more energy and motivation.  He is able to enjoy things.  Memory is within normal limits.  He denies suicidal or homicidal thoughts.  His attention span and concentration are still low.  Adding the Intuniv at the last visit has not helped at all.  He has not had any dizziness or feeling faint.   He still has anxiety in social situations but states it is not  controlling him as much as it was.  He is able to control it more.  Denies dizziness, syncope, seizures, numbness, tingling, tremor, tics, unsteady gait, slurred speech, confusion. Denies muscle or joint pain, stiffness, or dystonia.  Individual Medical History/ Review of Systems: Changes? :No    Past medications for mental health diagnoses include: Zoloft, Paxil, Wellbutrin XL, VyvanseandAdderallcaused incontinence (he believes it is related to amphetamines)Buspar, Concerta, Strattera, Jornay PM was ineffective, modafinil ineffective at low dose, Intuniv  Allergies: Patient has no known allergies.  Current Medications:  Current Outpatient Medications:  .  baclofen (LIORESAL) 20 MG tablet, Take 0.5-1 tablets (10-20 mg total) by mouth at bedtime as needed for muscle spasms., Disp: 90 tablet, Rfl: 3 .  buPROPion (WELLBUTRIN XL) 150 MG 24 hr tablet, Take 1 tablet (150 mg total) by mouth daily., Disp: 90 tablet, Rfl: 0 .  levothyroxine (SYNTHROID) 88 MCG tablet, 100 mcg. , Disp: , Rfl:  .  omeprazole (PRILOSEC) 10 MG capsule, Take 10 mg by mouth daily., Disp: , Rfl:  .  PARoxetine (PAXIL) 30 MG tablet, Take 1 tablet (30 mg total) by mouth daily., Disp: 90 tablet, Rfl: 0 .  AMBULATORY NON FORMULARY MEDICATION, Continuous positive airway pressure (CPAP) machine auto-titrate from 4-20 cm of H2O pressure, with all supplemental supplies as needed. Please fax 7 day data to [phone] (Patient not taking: Reported on 08/17/2018), Disp: 1 each, Rfl: 0 .  guanFACINE (INTUNIV) 2 MG TB24 ER tablet, Take 1 tablet (2 mg total) by mouth daily., Disp: 30 tablet, Rfl: 1 .  levothyroxine (SYNTHROID, LEVOTHROID)  50 MCG tablet, Take by mouth., Disp: , Rfl:  Medication Side Effects: none  Family Medical/ Social History: Changes? No  MENTAL HEALTH EXAM:  There were no vitals taken for this visit.There is no height or weight on file to calculate BMI.  General Appearance: unable to assess  Eye Contact:   unable to assess  Speech:  Clear and Coherent  Volume:  Normal  Mood:  Euthymic  Affect:  unable to assess  Thought Process:  Goal Directed and Descriptions of Associations: Intact  Orientation:  Full (Time, Place, and Person)  Thought Content: Logical   Suicidal Thoughts:  No  Homicidal Thoughts:  No  Memory:  WNL  Judgement:  Good  Insight:  Good  Psychomotor Activity:  unable to assess  Concentration:  Concentration: Fair and Attention Span: Fair  Recall:  Good  Fund of Knowledge: Good  Language: Good  Assets:  Desire for Improvement  ADL's:  Intact  Cognition: WNL  Prognosis:  Good    DIAGNOSES:    ICD-10-CM   1. Moderate episode of recurrent major depressive disorder (HCC)  F33.1 buPROPion (WELLBUTRIN XL) 150 MG 24 hr tablet    PARoxetine (PAXIL) 30 MG tablet  2. Attention deficit hyperactivity disorder (ADHD), combined type  F90.2 buPROPion (WELLBUTRIN XL) 150 MG 24 hr tablet  3. Social anxiety disorder  F40.10 PARoxetine (PAXIL) 30 MG tablet    Receiving Psychotherapy: Yes    RECOMMENDATIONS:  I spent 20 minutes with him. PDMP was reviewed. I am glad he is doing better with the depression and social anxiety! Increase Intuniv to 2 mg p.o. daily.  If this is not effective at all within 2 weeks, he will call and let me know.  At that point we will increase to 3 mg.  If he is at least 25 to 50% better, stay on the 2 mg until the next visit. Continue Wellbutrin XL 150 mg daily. Continue Paxil 30 mg 1 p.o. every morning. Continue therapy. Return in 4 weeks.  Donnal Moat, PA-C

## 2019-05-13 DIAGNOSIS — F902 Attention-deficit hyperactivity disorder, combined type: Secondary | ICD-10-CM | POA: Diagnosis not present

## 2019-05-21 ENCOUNTER — Other Ambulatory Visit: Payer: Self-pay | Admitting: Family Medicine

## 2019-05-22 DIAGNOSIS — F902 Attention-deficit hyperactivity disorder, combined type: Secondary | ICD-10-CM | POA: Diagnosis not present

## 2019-05-27 MED ORDER — BACLOFEN 20 MG PO TABS: 10.0000 mg | ORAL_TABLET | Freq: Every evening | ORAL | 3 refills | Status: DC | PRN

## 2019-05-27 NOTE — Telephone Encounter (Signed)
Patient called regarding the Baclofen prescription refill request. I advised him that he would need an appointment for a refill because he has not been seen by Dr [name] in a year, but he asked that I send a message to see if it can be filled.

## 2019-05-27 NOTE — Addendum Note (Signed)
Addended by: Rodolph Bong on: 05/27/2019 12:23 PM   Modules accepted: Orders

## 2019-05-27 NOTE — Telephone Encounter (Signed)
Baclofen refilled.

## 2019-05-29 DIAGNOSIS — F902 Attention-deficit hyperactivity disorder, combined type: Secondary | ICD-10-CM | POA: Diagnosis not present

## 2019-06-04 DIAGNOSIS — F902 Attention-deficit hyperactivity disorder, combined type: Secondary | ICD-10-CM | POA: Diagnosis not present

## 2019-06-12 DIAGNOSIS — F902 Attention-deficit hyperactivity disorder, combined type: Secondary | ICD-10-CM | POA: Diagnosis not present

## 2019-07-03 DIAGNOSIS — F902 Attention-deficit hyperactivity disorder, combined type: Secondary | ICD-10-CM | POA: Diagnosis not present

## 2019-07-12 DIAGNOSIS — F902 Attention-deficit hyperactivity disorder, combined type: Secondary | ICD-10-CM | POA: Diagnosis not present

## 2019-07-19 DIAGNOSIS — F902 Attention-deficit hyperactivity disorder, combined type: Secondary | ICD-10-CM | POA: Diagnosis not present

## 2019-07-31 DIAGNOSIS — F902 Attention-deficit hyperactivity disorder, combined type: Secondary | ICD-10-CM | POA: Diagnosis not present

## 2019-08-06 DIAGNOSIS — F902 Attention-deficit hyperactivity disorder, combined type: Secondary | ICD-10-CM | POA: Diagnosis not present

## 2019-08-13 DIAGNOSIS — F902 Attention-deficit hyperactivity disorder, combined type: Secondary | ICD-10-CM | POA: Diagnosis not present

## 2019-08-14 ENCOUNTER — Encounter: Payer: Self-pay | Admitting: Family Medicine

## 2019-08-14 ENCOUNTER — Ambulatory Visit: Payer: Self-pay

## 2019-08-14 ENCOUNTER — Other Ambulatory Visit: Payer: Self-pay

## 2019-08-14 ENCOUNTER — Ambulatory Visit: Payer: BC Managed Care – PPO | Admitting: Family Medicine

## 2019-08-14 VITALS — BP 120/78 | HR 56 | Ht 72.0 in | Wt 217.6 lb

## 2019-08-14 DIAGNOSIS — R7989 Other specified abnormal findings of blood chemistry: Secondary | ICD-10-CM | POA: Diagnosis not present

## 2019-08-14 DIAGNOSIS — D649 Anemia, unspecified: Secondary | ICD-10-CM | POA: Diagnosis not present

## 2019-08-14 DIAGNOSIS — M25511 Pain in right shoulder: Secondary | ICD-10-CM

## 2019-08-14 DIAGNOSIS — G2581 Restless legs syndrome: Secondary | ICD-10-CM | POA: Insufficient documentation

## 2019-08-14 DIAGNOSIS — E538 Deficiency of other specified B group vitamins: Secondary | ICD-10-CM

## 2019-08-14 DIAGNOSIS — R252 Cramp and spasm: Secondary | ICD-10-CM

## 2019-08-14 LAB — CBC
HCT: 38.7 % — ABNORMAL LOW (ref 39.0–52.0)
Hemoglobin: 13.1 g/dL (ref 13.0–17.0)
MCHC: 33.9 g/dL (ref 30.0–36.0)
MCV: 85.9 fl (ref 78.0–100.0)
Platelets: 144 10*3/uL — ABNORMAL LOW (ref 150.0–400.0)
RBC: 4.5 Mil/uL (ref 4.22–5.81)
RDW: 12.5 % (ref 11.5–15.5)
WBC: 4.7 10*3/uL (ref 4.0–10.5)

## 2019-08-14 LAB — VITAMIN B12: Vitamin B-12: 489 pg/mL (ref 211–911)

## 2019-08-14 LAB — TSH: TSH: 4.89 u[IU]/mL — ABNORMAL HIGH (ref 0.35–4.50)

## 2019-08-14 NOTE — Patient Instructions (Signed)
Thank you for coming in today. Rehab the shoulder with band exercises.  Allow the tendon the lengthen slowly while resisting a force that is trying to lengthen it.  If not improving let me know and I will plan for PT>  Use voltaren gel over the counter.   For the restless leg syndrome. I will do labs today.  If labs are normal next step is try Requip.  I will get results to you ASAP.   Ropinirole tablets What is this medicine? ROPINIROLE (roe PIN i role) is used to treat the symptoms of Parkinson's disease. It helps to improve muscle control and movement difficulties. It is also used for the treatment of Restless Legs Syndrome. This medicine may be used for other purposes; ask your health care provider or pharmacist if you have questions. COMMON BRAND NAME(S): Requip What should I tell my health care provider before I take this medicine? They need to know if you have any of these conditions:  heart disease  high blood pressure  kidney disease  liver disease  low blood pressure  narcolepsy  sleep apnea  an unusual or allergic reaction to ropinirole, other medicines, foods, dyes, or preservatives  pregnant or trying to get pregnant  breast-feeding How should I use this medicine? Take this medicine by mouth with a glass of water. Follow the directions on the prescription label. You can take it with or without food. If it upsets your stomach, take it with food. Take your doses at regular intervals. Do not take your medicine more often than directed. Do not stop taking this medicine except on your doctor's advice. Stopping this medicine too quickly may cause serious side effects. Talk to your pediatrician regarding the use of this medicine in children. Special care may be needed. Overdosage: If you think you have taken too much of this medicine contact a poison control center or emergency room at once. NOTE: This medicine is only for you. Do not share this medicine with  others. What if I miss a dose? If you miss a dose, take it as soon as you can. If it is almost time for your next dose, take only that dose. Do not take double or extra doses. What may interact with this medicine?  certain medicines for depression, mood, or psychotic disorders  ciprofloxacin  male hormones, like estrogens and birth control pills  fluvoxamine  metoclopramide  mexiletine  norfloxacin  omeprazole  rifampin This list may not describe all possible interactions. Give your health care provider a list of all the medicines, herbs, non-prescription drugs, or dietary supplements you use. Also tell them if you smoke, drink alcohol, or use illegal drugs. Some items may interact with your medicine. What should I watch for while using this medicine? Visit your health care professional for regular checks on your progress. Tell your health care professional if your symptoms do not start to get better or if they get worse. Do not stop taking except on your health care professional's advice. You may develop a severe reaction. Your health care professional will tell you how much medicine to take. You may get drowsy or dizzy. Do not drive, use machinery, or do anything that needs mental alertness until you know how this drug affects you. Do not stand or sit up quickly, especially if you are an older patient. This reduces the risk of dizzy or fainting spells. Alcohol may interfere with the effect of this medicine. Avoid alcoholic drinks. When taking this medicine, you may  fall asleep without notice. You may be doing activities like driving a car, talking, or eating. You may not feel drowsy before it happens. Contact your health care provider right away if this happens to you. There have been reports of increased sexual urges or other strong urges such as gambling while taking this medicine. If you experience any of these while taking this medicine, you should report this to your health care  provider as soon as possible. Your mouth may get dry. Chewing sugarless gum or sucking hard candy and drinking plenty of water may help. Contact your health care professional if the problem does not go away or is severe. You should check your skin often for changes to moles and new growths while taking this medicine. Call your doctor if you notice any of these changes. What side effects may I notice from receiving this medicine? Side effects that you should report to your doctor or health care professional as soon as possible:  allergic reactions like skin rash, itching or hives, swelling of the face, lips, or tongue  breathing problems  changes in emotions or moods  changes in vision  chest pain  confusion  falling asleep during normal activities like driving  fast, irregular heartbeat  hallucinations  joint or muscle pain  loss of bladder control  loss of memory  new or increased gambling urges, sexual urges, uncontrolled spending, binge or compulsive eating, or other urges  pain, tingling, numbness in the hands or feet  signs and symptoms of low blood pressure like dizziness; feeling faint or lightheaded, falls; unusually weak or tired  swelling of the ankles, feet, hands  uncontrollable movements of the arms, face, head, mouth, neck, or upper body  vomiting Side effects that usually do not require medical attention (report to your doctor or health care professional if they continue or are bothersome):  dizziness  drowsiness  headache  increased sweating  nausea This list may not describe all possible side effects. Call your doctor for medical advice about side effects. You may report side effects to FDA at 1-800-FDA-1088. Where should I keep my medicine? Keep out of the reach of children. Store at room temperature between 20 and 25 degrees C (68 and 77 degrees F). Protect from light and moisture. Keep container tightly closed. Throw away any unused medicine  after the expiration date. NOTE: This sheet is a summary. It may not cover all possible information. If you have questions about this medicine, talk to your doctor, pharmacist, or health care provider.  2020 Elsevier/Gold Standard (2018-11-08 16:52:05)

## 2019-08-14 NOTE — Progress Notes (Signed)
TSH is minimally elevated indicating that your doctor may need to adjust your thyroid hormone again.  However its not very much off at all and could be considered okay.Vitamin B12 looks fine.Hemoglobin/hematocrit is very slightly low.  We will learn more about this when the iron store labs come back tomorrow or the next day.

## 2019-08-14 NOTE — Progress Notes (Signed)
I, Christoper Fabian, LAT, ATC, am serving as scribe for Dr. Clementeen Graham.  Jay Schneider Jay Schneider is a 54 y.o. male who presents to [facility] at [facility] today for shoulder and restless leg syndrome.  He was last seen by Dr. [name] on 05/29/18 for L calf pain.  Since then, he notes  R Shoulder: Pain x 3 weeks w/ no known MOI.  He denies any radiating pain into the R UE. -Mechanical symptoms: No -Aggravating factors: horiz aBd; bench press; pec flies; throwing motion -Treatments tried: self stretching and strengthening that he found online   Restless Leg: Reports symptoms in both legs that has worsened over the past 2 months.  He reports no pain, just a sensation that he needs to move his legs at night when he's trying to fall asleep. -Treatments tried: Baclofen for lower extremity cramping at bedtime which is helpful.  He does not think this is helped his restless leg syndrome symptoms much.  He notes he has had lifestyle change over the last few years.  He switched to a vegan diet and is exercising much more recently.  He notes overall this is helped his pain significantly.  He did have some lab work-up at PCP office June 2020 that showed slightly low hemoglobin.  He notes TSH has been normal on levothyroxine.  Pertinent review of systems: No fevers or chills  Relevant historical information: Restless leg syndrome cramping hypothyroidism depression sleep apnea ADHD   Exam:  BP 120/78 (BP Location: Right Arm, Patient Position: Sitting, Cuff Size: Large)   Pulse (!) 56   Ht 6' (1.829 m)   Wt 217 lb 9.6 oz (98.7 kg)   SpO2 97%   BMI 29.51 kg/m  General: Well Developed, well nourished, and in no acute distress.   MSK: Right shoulder normal-appearing Normal motion. Nontender. Normal strength. Some pain with resisted internal rotation. Negative Hawkins and Neer's test. Negative Yergason's and speeds test. Negative crossover arm compression test O'Brien's test  clunk relocation test and sulcus sign.  Lower extremities normal-appearing normal motion.    Lab and Radiology Results  Diagnostic Limited MSK Ultrasound of: Right shoulder Biceps tendon normal-appearing intact in bicipital groove. Subscapularis tendon normal. Supraspinatus tendon normal-appearing Normal subacromial bursa appearance. Infraspinatus tendon normal-appearing AC joint normal-appearing Impression: Normal right shoulder ultrasound     Assessment and Plan: 54 y.o. male with mild right shoulder pain over the last few weeks with increased weight lifting.  No significant abnormal findings on today's physical exam and ultrasound examination.  Likely little bit of overuse or strain.  Plan for rehab exercises taught in clinic today by me.  If not improving in a few weeks to let me know and I will proceed with order for formal physical therapy which will likely be helpful.  Also recommend Voltaren gel.  Restless leg syndrome: Worsening recently.  Sometimes low iron stores can exacerbate restless leg syndrome.  He had his hemoglobin checked back in June 2020 and it was a bit low and he is eating a vegan diet.  We will go ahead and check some basic labs including CBC iron stores B12 and TSH to follow-up potential causes of exacerbation of restless leg syndrome.  If I can find something that I can treat we can try that first however next treatment method would probably be Requip.  I am not sure if this relates to the leg cramping that he has been experiencing previously that is well managed with baclofen.  Continue baclofen for now.   PDMP not reviewed this encounter. Orders Placed This Encounter  Procedures  . [place] LIMITED JOINT SPACE STRUCTURES UP RIGHT(NO LINKED CHARGES)    Order Specific Question:   Reason for Exam (SYMPTOM  OR DIAGNOSIS REQUIRED)    Answer:   R shoulder pain    Order Specific Question:   Preferred imaging location?    Answer:   [facility]  . CBC    Standing Status:   Future    Number of Occurrences:   1    Standing Expiration Date:   11/14/2019  . Iron, TIBC and Ferritin Panel    Standing Status:   Future    Number of Occurrences:   1    Standing Expiration Date:   11/14/2019  . B12    Standing Status:   Future    Number of Occurrences:   1    Standing Expiration Date:   08/13/2020  . TSH    Standing Status:   Future    Number of Occurrences:   1    Standing Expiration Date:   11/14/2019   No orders of the defined types were placed in this encounter.    Discussed warning signs or symptoms. Please see discharge instructions. Patient expresses understanding.   The above documentation has been reviewed and is accurate and complete Lynne Leader, M.D.

## 2019-08-15 ENCOUNTER — Encounter: Payer: Self-pay | Admitting: Family Medicine

## 2019-08-15 LAB — IRON,TIBC AND FERRITIN PANEL
%SAT: 23 % (calc) (ref 20–48)
Ferritin: 7 ng/mL — ABNORMAL LOW (ref 38–380)
Iron: 98 ug/dL (ref 50–180)
TIBC: 429 mcg/dL (calc) — ABNORMAL HIGH (ref 250–425)

## 2019-08-15 NOTE — Progress Notes (Signed)
Iron stores show very low ferritin which indicate low iron stores.  Please start taking iron supplementation.  I recommend Iron Bisglycinate - 25 mg available over-the-counter (amazon)1-3 times daily.  High doses of this tend to cause GI side effects so take as much as you can up to 3 times daily.  [facility] recheck your iron stores in about 3 months.  Expect constipation from this medication.  This is probably the best GI tolerated iron supplementation.

## 2019-08-17 ENCOUNTER — Other Ambulatory Visit: Payer: Self-pay | Admitting: Physician Assistant

## 2019-08-17 DIAGNOSIS — F4010 Social phobia, unspecified: Secondary | ICD-10-CM

## 2019-08-17 DIAGNOSIS — F902 Attention-deficit hyperactivity disorder, combined type: Secondary | ICD-10-CM

## 2019-08-17 DIAGNOSIS — F331 Major depressive disorder, recurrent, moderate: Secondary | ICD-10-CM

## 2019-08-20 NOTE — Telephone Encounter (Signed)
Last apt 02/17, was due back in 4 weeks

## 2019-08-21 DIAGNOSIS — F902 Attention-deficit hyperactivity disorder, combined type: Secondary | ICD-10-CM | POA: Diagnosis not present

## 2019-09-06 DIAGNOSIS — F411 Generalized anxiety disorder: Secondary | ICD-10-CM | POA: Diagnosis not present

## 2019-09-06 DIAGNOSIS — F902 Attention-deficit hyperactivity disorder, combined type: Secondary | ICD-10-CM | POA: Diagnosis not present

## 2019-09-06 DIAGNOSIS — F3341 Major depressive disorder, recurrent, in partial remission: Secondary | ICD-10-CM | POA: Diagnosis not present

## 2019-09-10 DIAGNOSIS — F902 Attention-deficit hyperactivity disorder, combined type: Secondary | ICD-10-CM | POA: Diagnosis not present

## 2019-09-18 DIAGNOSIS — F902 Attention-deficit hyperactivity disorder, combined type: Secondary | ICD-10-CM | POA: Diagnosis not present

## 2019-10-03 DIAGNOSIS — F902 Attention-deficit hyperactivity disorder, combined type: Secondary | ICD-10-CM | POA: Diagnosis not present

## 2019-10-10 DIAGNOSIS — F902 Attention-deficit hyperactivity disorder, combined type: Secondary | ICD-10-CM | POA: Diagnosis not present

## 2019-10-24 DIAGNOSIS — F902 Attention-deficit hyperactivity disorder, combined type: Secondary | ICD-10-CM | POA: Diagnosis not present

## 2019-11-07 ENCOUNTER — Ambulatory Visit: Payer: BC Managed Care – PPO | Admitting: Family Medicine

## 2019-11-08 DIAGNOSIS — F902 Attention-deficit hyperactivity disorder, combined type: Secondary | ICD-10-CM | POA: Diagnosis not present

## 2019-11-12 ENCOUNTER — Ambulatory Visit: Payer: BC Managed Care – PPO | Admitting: Family Medicine

## 2019-11-12 ENCOUNTER — Other Ambulatory Visit: Payer: Self-pay

## 2019-11-12 ENCOUNTER — Encounter: Payer: Self-pay | Admitting: Family Medicine

## 2019-11-12 VITALS — BP 130/82 | HR 72 | Ht 72.0 in | Wt 226.4 lb

## 2019-11-12 DIAGNOSIS — R790 Abnormal level of blood mineral: Secondary | ICD-10-CM

## 2019-11-12 DIAGNOSIS — E039 Hypothyroidism, unspecified: Secondary | ICD-10-CM | POA: Diagnosis not present

## 2019-11-12 DIAGNOSIS — G2581 Restless legs syndrome: Secondary | ICD-10-CM | POA: Diagnosis not present

## 2019-11-12 DIAGNOSIS — D649 Anemia, unspecified: Secondary | ICD-10-CM | POA: Diagnosis not present

## 2019-11-12 MED ORDER — GABAPENTIN 300 MG PO CAPS
300.0000 mg | ORAL_CAPSULE | Freq: Every evening | ORAL | 3 refills | Status: DC | PRN
Start: 2019-11-12 — End: 2022-03-29

## 2019-11-12 NOTE — Progress Notes (Signed)
I, Christoper Fabian, LAT, ATC, am serving as scribe for Dr. Clementeen Graham.  Jay Schneider is a 54 y.o. male who presents to [facility] at [facility] today for f/u of restless leg syndrome.  He was last seen by Dr. [name] on 08/14/19 for his R shoulder and RLS.  He was advised to con't the Baclofen medication that he had been taking for the RLS but advised that a switch to Requip may be needed.  Since his last visit, pt reports that he has been taking iron supplements. As part of his evaluation for restless leg syndrome his iron stores were assessed and his ferritin was quite low. He was treated with iron supplementation iron bis glycinate once to twice daily. He notes this is worked very well however he is becoming constipated and having some stomach irritation. He notes overall he is feeling pretty well but notes occasional intermittent nighttime restless leg syndrome.  He notes that he has been getting the RLS symptoms less frequently but wants to know how to balance the iron supplementation w/ his symptoms.  Additionally as part of his last work-up TSH was checked and was a little bit elevated. He is taking levothyroxine but it has been over 3 months since his levels were recently checked.  Pertinent review of systems: No fevers or chills  Relevant historical information: Vegan diet, ADHD, leg cramping history.   Exam:  BP 130/82 (BP Location: Right Arm, Patient Position: Sitting, Cuff Size: Large)   Pulse 72   Ht 6' (1.829 m)   Wt 226 lb 6.4 oz (102.7 kg)   SpO2 97%   BMI 30.71 kg/m  General: Well Developed, well nourished, and in no acute distress.   MSK: Normal gait    Lab and Radiology Results  Component     Latest Ref Rng & Units 08/14/2019  WBC     4.0 - 10.5 K/uL 4.7  RBC     4.22 - 5.81 Mil/uL 4.50  Platelets     150 - 400 K/uL 144.0 (L)  Hemoglobin     13.0 - 17.0 g/dL 17.4  HCT     39 - 52 % 38.7 (L)  MCV     78.0 - 100.0 fl 85.9    MCHC     30.0 - 36.0 g/dL 08.1  RDW     44.8 - 18.5 % 12.5  Iron     50 - 180 mcg/dL 98  TIBC     631 - 497 mcg/dL (calc) 026 (H)  %SAT     20 - 48 % (calc) 23  Ferritin     38 - 380 ng/mL 7 (L)  TSH     0.35 - 4.50 uIU/mL 4.89 (H)  Vitamin B12     211 - 911 pg/mL 489      Assessment and Plan: 54 y.o. male with restless leg syndrome. Low iron stores probably an exacerbating factor for this. He has had significant improvement with iron repletion. At this point 3 months into treatment is reasonable to reassess iron stores to see if we can back off of the frequency of iron. I expect he will probably be able to take it simply daily or every other day and have much less GI side effects.  For mild breakthrough restless leg syndrome symptoms we will try gabapentin as needed as first-line. If not sufficient Requip as needed.  Additionally his TSH was a bit elevated at last check. Will reassess  and if still elevated refer back to PCP.     PDMP not reviewed this encounter. Orders Placed This Encounter  Procedures  . CBC    Standing Status:   Future    Number of Occurrences:   1    Standing Expiration Date:   11/11/2020  . Iron, TIBC and Ferritin Panel    Standing Status:   Future    Number of Occurrences:   1    Standing Expiration Date:   11/11/2020  . TSH    Standing Status:   Future    Number of Occurrences:   1    Standing Expiration Date:   11/11/2020   Meds ordered this encounter  Medications  . gabapentin (NEURONTIN) 300 MG capsule    Sig: Take 1 capsule (300 mg total) by mouth at bedtime as needed (RLS bad night).    Dispense:  30 capsule    Refill:  3     Discussed warning signs or symptoms. Please see discharge instructions. Patient expresses understanding.   The above documentation has been reviewed and is accurate and complete Clementeen Graham, M.D.

## 2019-11-12 NOTE — Patient Instructions (Signed)
Thank you for coming in today. Get lab today.  Labs will be in mychart shortly  Likely will reduce oral iron to 1 per day or less.   Use gabapentin as needed for RLS at bedtime.  If not helpful we can use other medicines.   I will make sure your PCP gets a copy of the labs and note from today.

## 2019-11-13 LAB — IRON,TIBC AND FERRITIN PANEL
%SAT: 10 % (calc) — ABNORMAL LOW (ref 20–48)
Ferritin: 8 ng/mL — ABNORMAL LOW (ref 38–380)
Iron: 41 ug/dL — ABNORMAL LOW (ref 50–180)
TIBC: 424 mcg/dL (calc) (ref 250–425)

## 2019-11-13 LAB — TSH: TSH: 3.14 mIU/L (ref 0.40–4.50)

## 2019-11-13 NOTE — Progress Notes (Signed)
Casimiro your iron has gotten worse over the last 3 months which indicates that you are losing iron somewhere.  The most likely explanation is that you are having slow bleeding somewhere usually the intestine.  I see that you had colonoscopy and upper endoscopy June 2019.  I think it is worth a recheck with your gastroenterologist or at least your primary care provider.  You should not continue to be iron deficient despite abundant oral iron.

## 2019-11-14 DIAGNOSIS — F902 Attention-deficit hyperactivity disorder, combined type: Secondary | ICD-10-CM | POA: Diagnosis not present

## 2019-11-21 ENCOUNTER — Ambulatory Visit
Admission: RE | Admit: 2019-11-21 | Discharge: 2019-11-21 | Disposition: A | Discharge status: Home / Self Care | Payer: BC Managed Care – PPO | Source: Ambulatory Visit | Attending: Emergency Medicine | Admitting: Emergency Medicine

## 2019-11-21 ENCOUNTER — Other Ambulatory Visit: Payer: Self-pay

## 2019-11-21 VITALS — BP 126/87 | HR 68 | Temp 97.9°F | Resp 18 | Ht 72.0 in | Wt 218.0 lb

## 2019-11-21 DIAGNOSIS — Z1152 Encounter for screening for COVID-19: Secondary | ICD-10-CM

## 2019-11-21 DIAGNOSIS — R112 Nausea with vomiting, unspecified: Secondary | ICD-10-CM | POA: Diagnosis not present

## 2019-11-21 HISTORY — DX: Gastro-esophageal reflux disease without esophagitis: K21.9

## 2019-11-21 HISTORY — DX: Attention-deficit hyperactivity disorder, unspecified type: F90.9

## 2019-11-21 HISTORY — DX: Restless legs syndrome: G25.81

## 2019-11-21 HISTORY — DX: Iron deficiency: E61.1

## 2019-11-21 HISTORY — DX: Cramp and spasm: R25.2

## 2019-11-21 MED ORDER — ONDANSETRON 4 MG PO TBDP: 4.0000 mg | ORAL_TABLET | Freq: Three times a day (TID) | ORAL | 0 refills | Status: DC | PRN

## 2019-11-21 NOTE — ED Triage Notes (Signed)
Nausea, fatigue, headache and cold sweats that started a couple of days ago. Pt states he had an episode like this a few weeks ago and seen his pcp , he has felt better until now.  Denies any pain.

## 2019-11-21 NOTE — ED Provider Notes (Signed)
[facility]   [id number] 11/21/19 Arrival Time: 1059   CC: COVID symptoms  SUBJECTIVE: History from: patient.  Stuart Mirabile Jashawn Floyd is a 54 y.o. male w who presented to the urgent care for complaint of chills and fever, fatigue, nausea, headache that started a couple days ago.  Denies sick exposure to COVID, flu or strep.  Denies recent travel.  Has tried OTC medication without  relief.  Denies relieving factors.  Denies previous symptoms in the past.   Denies fever, chills, fatigue, sinus pain, rhinorrhea, sore throat, SOB, wheezing, chest pain, nausea, changes in bowel or bladder habits.      ROS: As per HPI.  All other pertinent ROS negative.     Past Medical History:  Diagnosis Date  . Adult ADHD   . Connective tissue stenosis of neural canal of lumbar region 03/17/2014  . Gastroesophageal reflux disease without esophagitis 01/27/2016  . GERD (gastroesophageal reflux disease)   . Hypothyroidism 03/04/2016  . Low iron   . Muscle cramps   . Restless leg    History reviewed. No pertinent surgical history. No Known Allergies No current facility-administered medications on file prior to encounter.   Current Outpatient Medications on File Prior to Encounter  Medication Sig Dispense Refill  . AMBULATORY NON FORMULARY MEDICATION Continuous positive airway pressure (CPAP) machine auto-titrate from 4-20 cm of H2O pressure, with all supplemental supplies as needed. Please fax 7 day data to [phone] (Patient not taking: Reported on 08/17/2018) 1 each 0  . baclofen (LIORESAL) 20 MG tablet Take 0.5-1 tablets (10-20 mg total) by mouth at bedtime as needed for muscle spasms. 90 tablet 3  . buPROPion (WELLBUTRIN XL) 150 MG 24 hr tablet TAKE ONE TABLET BY MOUTH DAILY 30 tablet 0  . gabapentin (NEURONTIN) 300 MG capsule Take 1 capsule (300 mg total) by mouth at bedtime as needed (RLS bad night). 30 capsule 3  . guanFACINE (INTUNIV) 2 MG TB24 ER tablet Take 1 tablet (2 mg  total) by mouth daily. 30 tablet 1  . levothyroxine (SYNTHROID) 100 MCG tablet TAKE ONE TABLET BY MOUTH EVERY MORNING ON AN EMPTY STOMACH WITH A GLASS OF WATER AT LEAST 30 TO 60 MINUTES BEFORE BREAKFAST    . omeprazole (PRILOSEC) 10 MG capsule Take 10 mg by mouth daily.    Marland Kitchen PARoxetine (PAXIL) 30 MG tablet TAKE ONE TABLET BY MOUTH DAILY 30 tablet 0   Social History   Socioeconomic History  . Marital status: Married    Spouse name: Not on file  . Number of children: Not on file  . Years of education: Not on file  . Highest education level: Not on file  Occupational History  . Not on file  Tobacco Use  . Smoking status: Never Smoker  . Smokeless tobacco: Never Used  Substance and Sexual Activity  . Alcohol use: Not Currently    Alcohol/week: 0.0 - 1.0 standard drinks  . Drug use: Not Currently  . Sexual activity: Not on file  Other Topics Concern  . Not on file  Social History Narrative  . Not on file   Social Determinants of Health   Financial Resource Strain:   . Difficulty of Paying Living Expenses: Not on file  Food Insecurity:   . Worried About Programme researcher, broadcasting/film/video in the Last Year: Not on file  . Ran Out of Food in the Last Year: Not on file  Transportation Needs:   . Lack of Transportation (Medical): Not on file  .  Lack of Transportation (Non-Medical): Not on file  Physical Activity:   . Days of Exercise per Week: Not on file  . Minutes of Exercise per Session: Not on file  Stress:   . Feeling of Stress : Not on file  Social Connections:   . Frequency of Communication with Friends and Family: Not on file  . Frequency of Social Gatherings with Friends and Family: Not on file  . Attends Religious Services: Not on file  . Active Member of Clubs or Organizations: Not on file  . Attends Banker Meetings: Not on file  . Marital Status: Not on file  Intimate Partner Violence:   . Fear of Current or Ex-Partner: Not on file  . Emotionally Abused: Not on  file  . Physically Abused: Not on file  . Sexually Abused: Not on file   No family history on file.  OBJECTIVE:  Vitals:   11/21/19 1109 11/21/19 1111  BP:  126/87  Pulse:  68  Resp:  18  Temp:  97.9 F (36.6 C)  TempSrc:  Oral  SpO2:  98%  Weight: 218 lb (98.9 kg)   Height: 6' (1.829 m)      General appearance: alert; appears fatigued, but nontoxic; speaking in full sentences and tolerating own secretions HEENT: NCAT; Ears: EACs clear, TMs pearly gray; Eyes: PERRL.  EOM grossly intact. Sinuses: nontender; Nose: nares patent without rhinorrhea, Throat: oropharynx clear, tonsils non erythematous or enlarged, uvula midline  Neck: supple without LAD Lungs: unlabored respirations, symmetrical air entry; cough: mild; no respiratory distress; CTAB Heart: regular rate and rhythm.  Radial pulses 2+ symmetrical bilaterally Skin: warm and dry Psychological: alert and cooperative; normal mood and affect  LABS:  No results found for this or any previous visit (from the past 24 hour(s)).   ASSESSMENT & PLAN:  1. Encounter for screening for COVID-19   2. Non-intractable vomiting with nausea, unspecified vomiting type     Meds ordered this encounter  Medications  . ondansetron (ZOFRAN ODT) 4 MG disintegrating tablet    Sig: Take 1 tablet (4 mg total) by mouth every 8 (eight) hours as needed for nausea or vomiting.    Dispense:  30 tablet    Refill:  0    Discharge Instructions  COVID testing ordered.  It will take between 2-7 days for test results.  Someone will contact you regarding abnormal results.    In the meantime: You should remain isolated in your home for 10 days from symptom onset AND greater than 24  hours after symptoms resolution (absence of fever without the use of fever-reducing medication and improvement in respiratory symptoms), whichever is longer Get plenty of rest and push fluids Zofran prescribed for nausea Use medications daily for symptom relief Use OTC  medications like ibuprofen or tylenol as needed fever or pain Call or go to the ED if you have any new or worsening symptoms such as fever, worsening cough, shortness of breath, chest tightness, chest pain, turning blue, changes in mental status, etc...   Reviewed expectations re: course of current medical issues. Questions answered. Outlined signs and symptoms indicating need for more acute intervention. Patient verbalized understanding. After Visit Summary given.      Note: This document was prepared using Dragon voice recognition software and may include unintentional dictation errors.    Durward Parcel, FNP 11/21/19 1148

## 2019-11-21 NOTE — Discharge Instructions (Signed)
COVID testing ordered.  It will take between 2-7 days for test results.  Someone will contact you regarding abnormal results.    In the meantime: You should remain isolated in your home for 10 days from symptom onset AND greater than 24 hours after symptoms resolution (absence of fever without the use of fever-reducing medication and improvement in respiratory symptoms), whichever is longer Get plenty of rest and push fluids Zofran prescribed for nausea Use medications daily for symptom relief Use OTC medications like ibuprofen or tylenol as needed fever or pain Call or go to the ED if you have any new or worsening symptoms such as fever, worsening cough, shortness of breath, chest tightness, chest pain, turning blue, changes in mental status, etc...  

## 2019-11-23 LAB — NOVEL CORONAVIRUS, NAA: SARS-CoV-2, NAA: NOT DETECTED

## 2019-12-25 ENCOUNTER — Other Ambulatory Visit: Payer: Self-pay

## 2019-12-25 ENCOUNTER — Ambulatory Visit
Admission: RE | Admit: 2019-12-25 | Discharge: 2019-12-25 | Disposition: A | Discharge status: Home / Self Care | Payer: BC Managed Care – PPO | Source: Ambulatory Visit | Attending: Emergency Medicine | Admitting: Emergency Medicine

## 2019-12-25 VITALS — BP 125/81 | HR 65 | Temp 98.1°F | Resp 18

## 2019-12-25 DIAGNOSIS — R21 Rash and other nonspecific skin eruption: Secondary | ICD-10-CM

## 2019-12-25 DIAGNOSIS — B029 Zoster without complications: Secondary | ICD-10-CM

## 2019-12-25 MED ORDER — IBUPROFEN 800 MG PO TABS: 800.0000 mg | ORAL_TABLET | Freq: Three times a day (TID) | ORAL | 0 refills | Status: DC

## 2019-12-25 MED ORDER — VALACYCLOVIR HCL 1 G PO TABS: 1000.0000 mg | ORAL_TABLET | Freq: Three times a day (TID) | ORAL | 0 refills | Status: AC

## 2019-12-25 MED ORDER — HYDROCODONE-ACETAMINOPHEN 5-325 MG PO TABS
1.0000 | ORAL_TABLET | Freq: Four times a day (QID) | ORAL | 0 refills | Status: DC | PRN
Start: 2019-12-25 — End: 2022-03-29

## 2019-12-25 NOTE — Discharge Instructions (Signed)
Begin Valtrex 3 times a day for the next 10 days Ibuprofen and Tylenol as needed for mild to moderate pain/daytime pain May use hydrocodone for severe pain, use sparingly, do not drive or work after taking Follow-up for any concerns of rash not improving or worsening

## 2019-12-25 NOTE — ED Triage Notes (Signed)
Pt here with rash to right forearm with pain x 10 days that pt thinks is shingles

## 2019-12-25 NOTE — ED Provider Notes (Signed)
[facility]    CSN: [id number] Arrival date & time: 12/25/19  4196      History   Chief Complaint Chief Complaint  Patient presents with  . Appointment    1000  . Rash    HPI Jay Schneider is a 54 y.o. male history of GERD, presenting today for evaluation of a rash.  Patient reports that he developed a rash to his right forearm approximately 10 days ago.  Reports rash was preceded by a prodrome of pain and burning.  Continues to have discomfort.  Initially thought was poison ivy, and was applying calamine as well as peppermint oil.  Rash is persisted.  Rash is only located on right forearm, pain does radiate more proximally, but denies rash anywhere else on trunk or other extremities.  Denies fevers.  HPI  Past Medical History:  Diagnosis Date  . Adult ADHD   . Connective tissue stenosis of neural canal of lumbar region 03/17/2014  . Gastroesophageal reflux disease without esophagitis 01/27/2016  . GERD (gastroesophageal reflux disease)   . Hypothyroidism 03/04/2016  . Low iron   . Muscle cramps   . Restless leg     Patient Active Problem List   Diagnosis Date Noted  . Restless leg syndrome 08/14/2019  . Moderate episode of recurrent major depressive disorder (HCC) 12/25/2017  . Sleep apnea 12/04/2017  . Attention deficit hyperactivity disorder (ADHD) 12/04/2017  . Social anxiety disorder 12/04/2017  . Synovitis of hand 12/16/2016  . Hypothyroidism 03/04/2016  . Leg cramping 03/04/2016  . Gastroesophageal reflux disease without esophagitis 01/27/2016  . Connective tissue stenosis of neural canal of lumbar region 03/17/2014    History reviewed. No pertinent surgical history.     Home Medications    Prior to Admission medications   Medication Sig Start Date End Date Taking? Authorizing Provider  AMBULATORY NON FORMULARY MEDICATION Continuous positive airway pressure (CPAP) machine auto-titrate from 4-20 cm of H2O pressure, with all  supplemental supplies as needed. Please fax 7 day data to [phone] Patient not taking: Reported on 08/17/2018 09/30/16   Rodolph Bong, MD  baclofen (LIORESAL) 20 MG tablet Take 0.5-1 tablets (10-20 mg total) by mouth at bedtime as needed for muscle spasms. 05/27/19   Rodolph Bong, MD  buPROPion (WELLBUTRIN XL) 150 MG 24 hr tablet TAKE ONE TABLET BY MOUTH DAILY 08/20/19   Melony Overly T, PA-C  gabapentin (NEURONTIN) 300 MG capsule Take 1 capsule (300 mg total) by mouth at bedtime as needed (RLS bad night). 11/12/19   Rodolph Bong, MD  guanFACINE (INTUNIV) 2 MG TB24 ER tablet Take 1 tablet (2 mg total) by mouth daily. 05/08/19   Cherie Ouch, PA-C  HYDROcodone-acetaminophen (NORCO/VICODIN) 5-325 MG tablet Take 1-2 tablets by mouth every 6 (six) hours as needed. 12/25/19   Makinna Andy C, PA-C  ibuprofen (ADVIL) 800 MG tablet Take 1 tablet (800 mg total) by mouth 3 (three) times daily. 12/25/19   Janan Bogie C, PA-C  levothyroxine (SYNTHROID) 100 MCG tablet TAKE ONE TABLET BY MOUTH EVERY MORNING ON AN EMPTY STOMACH WITH A GLASS OF WATER AT LEAST 30 TO 60 MINUTES BEFORE BREAKFAST 08/12/19   [provider]  omeprazole (PRILOSEC) 10 MG capsule Take 10 mg by mouth daily.    [provider]  ondansetron (ZOFRAN ODT) 4 MG disintegrating tablet Take 1 tablet (4 mg total) by mouth every 8 (eight) hours as needed for nausea or vomiting. 11/21/19   Avegno, Zachery Dakins,  FNP  PARoxetine (PAXIL) 30 MG tablet TAKE ONE TABLET BY MOUTH DAILY 08/20/19   Melony Overly T, PA-C  valACYclovir (VALTREX) 1000 MG tablet Take 1 tablet (1,000 mg total) by mouth 3 (three) times daily for 10 days. 12/25/19 01/04/20  Amaziah Ghosh, Junius Creamer, PA-C    Family History Family History  Family history unknown: Yes    Social History Social History   Tobacco Use  . Smoking status: Never Smoker  . Smokeless tobacco: Never Used  Substance Use Topics  . Alcohol use: Not Currently    Alcohol/week: 0.0 - 1.0 standard  drinks  . Drug use: Not Currently     Allergies   Patient has no known allergies.   Review of Systems Review of Systems  Constitutional: Negative for fatigue and fever.  Eyes: Negative for redness, itching and visual disturbance.  Respiratory: Negative for shortness of breath.   Cardiovascular: Negative for chest pain and leg swelling.  Gastrointestinal: Negative for nausea and vomiting.  Musculoskeletal: Negative for arthralgias and myalgias.  Skin: Positive for color change and rash. Negative for wound.  Neurological: Negative for dizziness, syncope, weakness, light-headedness and headaches.     Physical Exam Triage Vital Signs ED Triage Vitals  Enc Vitals Group     BP 12/25/19 0955 125/81     Pulse Rate 12/25/19 0955 65     Resp 12/25/19 0955 18     Temp 12/25/19 0955 98.1 F (36.7 C)     Temp Source 12/25/19 0955 Oral     SpO2 12/25/19 0955 96 %     Weight --      Height --      Head Circumference --      Peak Flow --      Pain Score 12/25/19 0956 6     Pain Loc --      Pain Edu? --      Excl. in GC? --    No data found.  Updated Vital Signs BP 125/81 (BP Location: Left Arm)   Pulse 65   Temp 98.1 F (36.7 C) (Oral)   Resp 18   SpO2 96%   Visual Acuity Right Eye Distance:   Left Eye Distance:   Bilateral Distance:    Right Eye Near:   Left Eye Near:    Bilateral Near:     Physical Exam Vitals and nursing note reviewed.  Constitutional:      Appearance: He is well-developed.     Comments: No acute distress  HENT:     Head: Normocephalic and atraumatic.     Nose: Nose normal.  Eyes:     Conjunctiva/sclera: Conjunctivae normal.  Cardiovascular:     Rate and Rhythm: Normal rate.  Pulmonary:     Effort: Pulmonary effort is normal. No respiratory distress.  Abdominal:     General: There is no distension.  Musculoskeletal:        General: Normal range of motion.     Cervical back: Neck supple.  Skin:    General: Skin is warm and dry.      Comments: Right forearm with clustered purple vesicular rash on erythematous base in dermatomal distribution, no rash noted elsewhere  Neurological:     Mental Status: He is alert and oriented to person, place, and time.      UC Treatments / Results  Labs (all labs ordered are listed, but only abnormal results are displayed) Labs Reviewed - No data to display  EKG   Radiology No results  found.  Procedures Procedures (including critical care time)  Medications Ordered in UC Medications - No data to display  Initial Impression / Assessment and Plan / UC Course  I have reviewed the triage vital signs and the nursing notes.  Pertinent labs & imaging results that were available during my care of the patient were reviewed by me and considered in my medical decision making (see chart for details).     Rashes suggestive of shingles, discussed utility of Valtrex after 10 days, but opting to proceed with prescribing despite out of 72-hour window.  Pain management with anti-inflammatories, hydrocodone for severe pain.  Monitor for continued gradual resolution of rash.  Discussed strict return precautions. Patient verbalized understanding and is agreeable with plan.  Final Clinical Impressions(s) / UC Diagnoses   Final diagnoses:  Rash and nonspecific skin eruption  Herpes zoster without complication     Discharge Instructions     Begin Valtrex 3 times a day for the next 10 days Ibuprofen and Tylenol as needed for mild to moderate pain/daytime pain May use hydrocodone for severe pain, use sparingly, do not drive or work after taking Follow-up for any concerns of rash not improving or worsening    ED Prescriptions    Medication Sig Dispense Auth. Provider   valACYclovir (VALTREX) 1000 MG tablet Take 1 tablet (1,000 mg total) by mouth 3 (three) times daily for 10 days. 30 tablet Tory Mckissack C, PA-C   HYDROcodone-acetaminophen (NORCO/VICODIN) 5-325 MG tablet Take 1-2  tablets by mouth every 6 (six) hours as needed. 12 tablet Arnika Larzelere C, PA-C   ibuprofen (ADVIL) 800 MG tablet Take 1 tablet (800 mg total) by mouth 3 (three) times daily. 21 tablet Prentiss Hammett, [place] C, PA-C     I have reviewed the PDMP during this encounter.   Beverely Suen, [place] C, PA-C 12/25/19 1025

## 2020-01-13 ENCOUNTER — Encounter (HOSPITAL_COMMUNITY): Payer: Self-pay | Admitting: Gastroenterology

## 2020-01-13 ENCOUNTER — Ambulatory Visit (HOSPITAL_COMMUNITY)
Admission: RE | Admit: 2020-01-13 | Discharge: 2020-01-13 | Disposition: A | Discharge status: Home / Self Care | Payer: BC Managed Care – PPO | Attending: Gastroenterology | Admitting: Gastroenterology

## 2020-01-13 ENCOUNTER — Other Ambulatory Visit: Payer: Self-pay | Admitting: Gastroenterology

## 2020-01-13 ENCOUNTER — Encounter (HOSPITAL_COMMUNITY)
Admission: RE | Disposition: A | Discharge status: Home / Self Care | Payer: Self-pay | Source: Home / Self Care | Attending: Gastroenterology

## 2020-01-13 DIAGNOSIS — D509 Iron deficiency anemia, unspecified: Secondary | ICD-10-CM | POA: Insufficient documentation

## 2020-01-13 HISTORY — PX: GIVENS CAPSULE STUDY: SHX5432

## 2020-01-13 SURGERY — IMAGING PROCEDURE, GI TRACT, INTRALUMINAL, VIA CAPSULE

## 2020-01-13 SURGICAL SUPPLY — 1 items: TOWEL COTTON PACK 4EA (MISCELLANEOUS) ×6 IMPLANT

## 2020-01-13 NOTE — Progress Notes (Signed)
Capsule ingested at 0903 with any difficulties. Pt provided with written and verbal instructions. Pt verbalizes understanding with no questions at this time. Pt aware that if the capsule blinks any other color than blue or green he is to give [place] a call. Pt to return capsule equipment to [facility] endoscopy tomorrow.

## 2020-01-14 ENCOUNTER — Encounter (HOSPITAL_COMMUNITY): Payer: Self-pay | Admitting: Gastroenterology

## 2020-02-21 ENCOUNTER — Other Ambulatory Visit: Payer: Self-pay | Admitting: Neurology

## 2020-02-21 DIAGNOSIS — G4710 Hypersomnia, unspecified: Secondary | ICD-10-CM

## 2020-02-21 DIAGNOSIS — G43809 Other migraine, not intractable, without status migrainosus: Secondary | ICD-10-CM

## 2020-03-03 ENCOUNTER — Other Ambulatory Visit: Payer: Self-pay

## 2020-03-03 ENCOUNTER — Ambulatory Visit
Admission: RE | Admit: 2020-03-03 | Discharge: 2020-03-03 | Disposition: A | Discharge status: Home / Self Care | Payer: BC Managed Care – PPO | Source: Ambulatory Visit | Attending: Neurology | Admitting: Neurology

## 2020-03-03 DIAGNOSIS — G4710 Hypersomnia, unspecified: Secondary | ICD-10-CM | POA: Insufficient documentation

## 2020-03-03 DIAGNOSIS — G43809 Other migraine, not intractable, without status migrainosus: Secondary | ICD-10-CM | POA: Diagnosis present

## 2020-03-03 MED ORDER — GADOBUTROL 1 MMOL/ML IV SOLN
10.0000 mL | Freq: Once | INTRAVENOUS | Status: AC | PRN
  Administered 2020-03-03: 09:00:00 10 mL via INTRAVENOUS

## 2020-03-31 ENCOUNTER — Ambulatory Visit: Payer: BC Managed Care – PPO | Attending: Internal Medicine

## 2020-03-31 DIAGNOSIS — Z9989 Dependence on other enabling machines and devices: Secondary | ICD-10-CM | POA: Diagnosis not present

## 2020-03-31 DIAGNOSIS — G478 Other sleep disorders: Secondary | ICD-10-CM | POA: Diagnosis not present

## 2020-03-31 DIAGNOSIS — G4710 Hypersomnia, unspecified: Secondary | ICD-10-CM | POA: Insufficient documentation

## 2020-03-31 DIAGNOSIS — G4733 Obstructive sleep apnea (adult) (pediatric): Secondary | ICD-10-CM | POA: Diagnosis present

## 2020-04-01 ENCOUNTER — Ambulatory Visit: Payer: BC Managed Care – PPO | Attending: Internal Medicine

## 2020-04-01 ENCOUNTER — Other Ambulatory Visit: Payer: Self-pay

## 2020-04-01 DIAGNOSIS — G4733 Obstructive sleep apnea (adult) (pediatric): Secondary | ICD-10-CM | POA: Insufficient documentation

## 2020-04-01 DIAGNOSIS — G4710 Hypersomnia, unspecified: Secondary | ICD-10-CM | POA: Insufficient documentation

## 2020-04-01 DIAGNOSIS — Z9989 Dependence on other enabling machines and devices: Secondary | ICD-10-CM | POA: Insufficient documentation

## 2020-04-01 DIAGNOSIS — G478 Other sleep disorders: Secondary | ICD-10-CM | POA: Diagnosis not present

## 2020-04-02 ENCOUNTER — Other Ambulatory Visit: Payer: Self-pay

## 2020-06-30 ENCOUNTER — Other Ambulatory Visit: Payer: Self-pay | Admitting: Family Medicine

## 2021-05-30 ENCOUNTER — Other Ambulatory Visit: Payer: Self-pay | Admitting: Family Medicine

## 2022-03-29 ENCOUNTER — Ambulatory Visit (INDEPENDENT_AMBULATORY_CARE_PROVIDER_SITE_OTHER): Payer: BC Managed Care – PPO

## 2022-03-29 ENCOUNTER — Ambulatory Visit: Payer: BC Managed Care – PPO | Admitting: Family Medicine

## 2022-03-29 VITALS — BP 172/92 | HR 70 | Ht 72.0 in | Wt 241.0 lb

## 2022-03-29 DIAGNOSIS — R252 Cramp and spasm: Secondary | ICD-10-CM | POA: Diagnosis not present

## 2022-03-29 DIAGNOSIS — M791 Myalgia, unspecified site: Secondary | ICD-10-CM

## 2022-03-29 DIAGNOSIS — R5383 Other fatigue: Secondary | ICD-10-CM | POA: Diagnosis not present

## 2022-03-29 DIAGNOSIS — M7918 Myalgia, other site: Secondary | ICD-10-CM | POA: Diagnosis not present

## 2022-03-29 DIAGNOSIS — E039 Hypothyroidism, unspecified: Secondary | ICD-10-CM

## 2022-03-29 LAB — MAGNESIUM: Magnesium: 2 mg/dL (ref 1.5–2.5)

## 2022-03-29 LAB — C-REACTIVE PROTEIN: CRP: <1 mg/dL (ref 0.5–20.0)

## 2022-03-29 LAB — TSH: TSH: 4.28 u[IU]/mL (ref 0.35–5.50)

## 2022-03-29 NOTE — Patient Instructions (Addendum)
Thank you for coming in today.   Please get labs today before you leave   Please get an Xray today before you leave   If both labs and xray are normal plan for MRI.

## 2022-03-29 NOTE — Progress Notes (Unsigned)
I, Peterson Lombard, LAT, ATC acting as a scribe for Lynne Leader, MD.  Subjective:    CC: Leg cramping  HPI: Pt is a 57 y/o male c/o bilat leg pain/cramping. Pt was previously seen by Dr. Georgina Snell on 11/12/19 for RLS. Today, pt reports the baclofen had been working, until he started Xyrem. Pt now has long-covid symptoms after his infect in May 2022. All have exacerbated his RLS, c/o pain/cramping, in his shin, feet, calves, and thighs. His cramping has significantly worsened over the last months to year.  He no longer can exercise without having significant cramping that night.  He denies pain or cramping with activity.  It is much worse at rest.  Dx testing: Labs  Pertinent review of Systems: No fevers or chills  Relevant historical information: Significant insomnia treated with Xyrem.  Hypothyroidism. Recent labs had mildly elevated serum calcium.  Objective:    Vitals:   03/29/22 1403  BP: (!) 172/92  Pulse: 70  SpO2: 97%   General: Well Developed, well nourished, and in no acute distress.   MSK: Lower extremities normal appearing nontender normal motion and strength.  Pulses and capillary fill are intact.  L-spine normal motion.  Lab and Radiology Results  X-ray images lumbar spine obtained today personally and independently interpreted DDD L5-S1 and facet DJD L4-5 L5-S1 are present.  No acute fractures are visible. Await formal radiology review    Impression and Recommendations:    Assessment and Plan: 57 y.o. male with myalgias and lower extremity cramping.  This is a progressive ongoing issue that has worsened over the last several years.  Previously it was pretty well-controlled with bedtime baclofen.  However his symptoms have worsened considerably.  He had some workup previously including a bit of the metabolic workup and at orthopedics a lumbar spine MRI and even a lower extremity exertional compartment testing that were all normal.  I think it is reasonable to  repeat some testing now that sometimes past and his symptoms have worsened.  Plan for a metabolic and rheumatologic workup listed below. He does have hypothyroidism and did have slightly elevated serum calcium recently so we will check that as well as typical rheumatology labs.  Will proceed to evaluate for spinal stenosis with lumbar spine x-ray and assuming labs are normal lumbar spine MRI.  If the lumbar spine MRI is normal we will likely proceed to nerve conduction study and ABI.  PDMP not reviewed this encounter. Orders Placed This Encounter  Procedures   DG Lumbar Spine 2-3 Views    Standing Status:   Future    Number of Occurrences:   1    Standing Expiration Date:   03/30/2023    Order Specific Question:   Reason for Exam (SYMPTOM  OR DIAGNOSIS REQUIRED)    Answer:   eval leg cramping    Order Specific Question:   Preferred imaging location?    Answer:   [facility]   TSH   PTH, Intact and Calcium   Magnesium   Sedimentation rate    Standing Status:   Future    Standing Expiration Date:   03/30/2023   Rheumatoid factor    Standing Status:   Future    Standing Expiration Date:   07/21/9765   Cyclic citrul peptide antibody, IgG    Standing Status:   Future    Standing Expiration Date:   03/30/2023   ANA    Standing Status:   Future    Standing Expiration  Date:   03/30/2023   CK    Standing Status:   Future    Standing Expiration Date:   03/30/2023   Complement, total    Standing Status:   Future    Standing Expiration Date:   03/30/2023   HLA-B27 antigen    Standing Status:   Future    Standing Expiration Date:   03/30/2023   Serum protein electrophoresis with reflex    Standing Status:   Future    Standing Expiration Date:   03/30/2023   C-reactive protein   No orders of the defined types were placed in this encounter.   Discussed warning signs or symptoms. Please see discharge instructions. Patient expresses understanding.   The above documentation has been  reviewed and is accurate and complete Clementeen Graham, M.D.

## 2022-03-30 NOTE — Progress Notes (Signed)
The first few labs that came back are reassuringly normal.  Other labs are still pending.

## 2022-03-31 ENCOUNTER — Encounter: Payer: Self-pay | Admitting: Family Medicine

## 2022-03-31 LAB — PTH, INTACT AND CALCIUM
Calcium: 10.6 mg/dL — ABNORMAL HIGH (ref 8.6–10.3)
PTH: 89 pg/mL — ABNORMAL HIGH (ref 16–77)

## 2022-03-31 NOTE — Progress Notes (Signed)
Lumbar spine x-ray shows a little bit of arthritis changes.

## 2022-04-01 ENCOUNTER — Encounter: Payer: Self-pay | Admitting: Family Medicine

## 2022-04-01 DIAGNOSIS — E039 Hypothyroidism, unspecified: Secondary | ICD-10-CM

## 2022-04-01 DIAGNOSIS — R7989 Other specified abnormal findings of blood chemistry: Secondary | ICD-10-CM

## 2022-04-01 NOTE — Progress Notes (Signed)
I did find something abnormal.  Your parathyroid hormone is elevated and your calcium is elevated. (The parathyroid hormone regulates calcium levels) The degree of elevation is mild but it could possibly explain some of your symptoms.  Do you have an endocrinologist that you were seen for your thyroid disease?  I do not see one in your chart and typically you will need one for just low thyroid hormone.   If you do not have a endocrinologist I think we should refer you to one. I sent to your email a chapter from my online textbook resource about clinical manifestations of hypercalcemia (elevated calcium) so you can do a little reading.  I sent it to [email or web address] If I should send it to a different email please let me know  Other labs are still pending.

## 2022-04-27 NOTE — Addendum Note (Signed)
Addended by: Gregor Hams on: 04/27/2022 11:56 AM   Modules accepted: Orders

## 2022-05-09 ENCOUNTER — Other Ambulatory Visit (HOSPITAL_COMMUNITY): Payer: Self-pay

## 2022-06-17 ENCOUNTER — Other Ambulatory Visit (HOSPITAL_COMMUNITY): Payer: Self-pay

## 2022-06-17 MED ORDER — LISDEXAMFETAMINE DIMESYLATE 30 MG PO CAPS
30.0000 mg | ORAL_CAPSULE | Freq: Every morning | ORAL | 0 refills | Status: DC
  Filled 2022-06-17 – 2022-06-21 (×2): qty 30, 30d supply, fill #0

## 2022-06-20 ENCOUNTER — Other Ambulatory Visit (HOSPITAL_COMMUNITY): Payer: Self-pay

## 2022-06-21 ENCOUNTER — Other Ambulatory Visit (HOSPITAL_COMMUNITY): Payer: Self-pay

## 2022-06-29 ENCOUNTER — Telehealth: Payer: Self-pay

## 2022-06-29 NOTE — Telephone Encounter (Signed)
Received referral from Dr. Dorothey Baseman at [facility] for uncontrolled narcolepsy. Placed in sleep mailbox

## 2022-07-19 ENCOUNTER — Other Ambulatory Visit (HOSPITAL_COMMUNITY): Payer: Self-pay

## 2022-07-19 MED ORDER — LISDEXAMFETAMINE DIMESYLATE 30 MG PO CAPS
30.0000 mg | ORAL_CAPSULE | Freq: Every morning | ORAL | 0 refills | Status: DC
  Filled 2022-07-19: qty 30, 30d supply, fill #0

## 2022-07-21 ENCOUNTER — Other Ambulatory Visit: Payer: Self-pay

## 2022-07-27 ENCOUNTER — Ambulatory Visit: Payer: Self-pay | Admitting: Urology

## 2022-08-11 ENCOUNTER — Ambulatory Visit: Payer: BC Managed Care – PPO | Admitting: Urology

## 2022-08-11 ENCOUNTER — Encounter: Payer: Self-pay | Admitting: Urology

## 2022-08-11 VITALS — BP 171/101 | HR 73 | Ht 72.0 in | Wt 234.0 lb

## 2022-08-11 DIAGNOSIS — N5312 Painful ejaculation: Secondary | ICD-10-CM

## 2022-08-11 LAB — URINALYSIS, COMPLETE
Bilirubin, UA: NEGATIVE
Glucose, UA: NEGATIVE
Leukocytes,UA: NEGATIVE
Nitrite, UA: NEGATIVE
RBC, UA: NEGATIVE
Specific Gravity, UA: 1.02 (ref 1.005–1.030)
Urobilinogen, Ur: 1 mg/dL (ref 0.2–1.0)
pH, UA: 7 (ref 5.0–7.5)

## 2022-08-11 LAB — MICROSCOPIC EXAMINATION

## 2022-08-11 MED ORDER — TAMSULOSIN HCL 0.4 MG PO CAPS: 0.4000 mg | ORAL_CAPSULE | Freq: Every day | ORAL | 1 refills | Status: DC

## 2022-08-11 NOTE — Progress Notes (Signed)
I, Maysun L Gibbs,acting as a scribe for Riki Altes, MD.,have documented all relevant documentation on the behalf of Riki Altes, MD,as directed by  Riki Altes, MD while in the presence of Riki Altes, MD.  08/11/2022 3:06 PM   Teola Bradley Adriana Mccallum [birth year] [id number]  Referring provider: Dorothey Baseman, MD [phone] [place],  [place] [address]  Chief Complaint  Patient presents with   Other    Painful ejaculation    HPI: Jay Schneider is a 57 y.o. male here for evaluation of painful ejaculation and urethral burning.  5-6 month history of burning urethral sensation, which occurs after ejaculation.  Pain usually resolves after 3-4 minutes.  No hematospermia or change in semen appearance.  No bothersome lower urinary tract symptoms.   PMH: Past Medical History:  Diagnosis Date   Adult ADHD    Connective tissue stenosis of neural canal of lumbar region 03/17/2014   Gastroesophageal reflux disease without esophagitis 01/27/2016   GERD (gastroesophageal reflux disease)    Hypothyroidism 03/04/2016   Low iron    Muscle cramps    Restless leg     Surgical History: Past Surgical History:  Procedure Laterality Date   GIVENS CAPSULE STUDY N/A 01/13/2020   Procedure: GIVENS CAPSULE STUDY;  Surgeon: Charna Elizabeth, MD;  Location: [facility] ENDOSCOPY;  Service: Endoscopy;  Laterality: N/A;    Home Medications:  Allergies as of 08/11/2022   No Known Allergies      Medication List        Accurate as of Aug 11, 2022  3:06 PM. If you have any questions, ask your nurse or doctor.          AMBULATORY NON FORMULARY MEDICATION Continuous positive airway pressure (CPAP) machine auto-titrate from 4-20 cm of H2O pressure, with all supplemental supplies as needed. Please fax 7 day data to [phone]   baclofen 20 MG tablet Commonly known as: LIORESAL TAKE 1/2 TO 1 TABLET BY MOUTH EVERY NIGHT AT BEDTIME   buPROPion 150 MG 24 hr  tablet Commonly known as: WELLBUTRIN XL TAKE ONE TABLET BY MOUTH DAILY   ferrous sulfate 325 (65 FE) MG tablet Take 325 mg by mouth daily with breakfast.   hydrocortisone cream 1 % Apply 1 application topically 2 (two) times daily as needed (irritation/pain.).   ibuprofen 800 MG tablet Commonly known as: ADVIL Take 1 tablet (800 mg total) by mouth 3 (three) times daily.   levothyroxine 100 MCG tablet Commonly known as: SYNTHROID Take 100 mcg by mouth daily before breakfast.   lisdexamfetamine 30 MG capsule Commonly known as: VYVANSE Take 30 mg by mouth daily.   lisdexamfetamine 30 MG capsule Commonly known as: Vyvanse Take 1 capsule (30 mg total) by mouth in the morning as directed   omeprazole 20 MG capsule Commonly known as: PRILOSEC Take 20 mg by mouth daily.   tamsulosin 0.4 MG Caps capsule Commonly known as: FLOMAX Take 1 capsule (0.4 mg total) by mouth daily. Started by: Riki Altes, MD   VITAMIN B-12 PO Take 1 tablet by mouth daily.   VITAMIN D3 PO Take 1 tablet by mouth daily.   XYREM PO Take by mouth.        Allergies: No Known Allergies  Family History: Family History  Family history unknown: Yes    Social History:  reports that he has never smoked. He has never used smokeless tobacco. He reports that he does not currently use alcohol. He  reports that he does not currently use drugs.   Physical Exam: BP (!) 171/101   Pulse 73   Ht 6' (1.829 m)   Wt 234 lb (106.1 kg)   BMI 31.74 kg/m   Constitutional:  Alert and oriented, No acute distress. HEENT: [place] AT Respiratory: Normal respiratory effort, no increased work of breathing. GU: Phallus without lesions, urethral meatus normal in appearance, testes descended bilaterally without masses or tenderness. Prostate 40 grams, smooth without nodules or tenderness. No pelvic floor tenderness. Psychiatric: Normal mood and affect.   Assessment & Plan:    1. Painful ejaculation Urinalysis  pending We discussed the possibility of prostatic inflammation as a cause of his pain and we'll give a trial of Tamsulosin 0.4 mg daily. He will call back regarding efficacy.  I have reviewed the above documentation for accuracy and completeness, and I agree with the above.   Riki Altes, MD  [facility] [address], [address] [place], [place] [address] [phone]

## 2022-08-13 ENCOUNTER — Ambulatory Visit
Admission: RE | Admit: 2022-08-13 | Discharge: 2022-08-13 | Disposition: A | Discharge status: Home / Self Care | Payer: BC Managed Care – PPO | Source: Ambulatory Visit | Attending: Urgent Care | Admitting: Urgent Care

## 2022-08-13 VITALS — BP 149/82 | HR 75 | Temp 97.9°F | Resp 16

## 2022-08-13 DIAGNOSIS — B354 Tinea corporis: Secondary | ICD-10-CM | POA: Diagnosis not present

## 2022-08-13 MED ORDER — FLUCONAZOLE 150 MG PO TABS: 150.0000 mg | ORAL_TABLET | ORAL | 0 refills | Status: DC

## 2022-08-13 NOTE — ED Triage Notes (Signed)
Pt c/o scattered rash x 4 days-NAD-steady gait

## 2022-08-13 NOTE — ED Provider Notes (Signed)
[facility]  Note:  This document was prepared using Conservation officer, historic buildings and may include unintentional dictation errors.  MRN: [id number] DOB: 06-May-1965  Subjective:   Jay Schneider is a 57 y.o. male presenting for 4-day history of scattered spots over his body.  The largest is over the leg but has other spots scattered on the torso and the left upper arm.  Generally is not bothering him.  Has not had any sick symptoms.  No known tick bites.  No current facility-administered medications for this encounter.  Current Outpatient Medications:    AMBULATORY NON FORMULARY MEDICATION, Continuous positive airway pressure (CPAP) machine auto-titrate from 4-20 cm of H2O pressure, with all supplemental supplies as needed. Please fax 7 day data to [phone], Disp: 1 each, Rfl: 0   baclofen (LIORESAL) 20 MG tablet, TAKE 1/2 TO 1 TABLET BY MOUTH EVERY NIGHT AT BEDTIME, Disp: 90 tablet, Rfl: 3   buPROPion (WELLBUTRIN XL) 150 MG 24 hr tablet, TAKE ONE TABLET BY MOUTH DAILY, Disp: 30 tablet, Rfl: 0   Cholecalciferol (VITAMIN D3 PO), Take 1 tablet by mouth daily., Disp: , Rfl:    Cyanocobalamin (VITAMIN B-12 PO), Take 1 tablet by mouth daily., Disp: , Rfl:    ferrous sulfate 325 (65 FE) MG tablet, Take 325 mg by mouth daily with breakfast., Disp: , Rfl:    hydrocortisone cream 1 %, Apply 1 application topically 2 (two) times daily as needed (irritation/pain.)., Disp: , Rfl:    ibuprofen (ADVIL) 800 MG tablet, Take 1 tablet (800 mg total) by mouth 3 (three) times daily., Disp: 21 tablet, Rfl: 0   levothyroxine (SYNTHROID) 100 MCG tablet, Take 100 mcg by mouth daily before breakfast. , Disp: , Rfl:    lisdexamfetamine (VYVANSE) 30 MG capsule, Take 30 mg by mouth daily., Disp: , Rfl:    lisdexamfetamine (VYVANSE) 30 MG capsule, Take 1 capsule (30 mg total) by mouth in the morning as directed, Disp: 30 capsule, Rfl: 0   omeprazole (PRILOSEC) 20 MG  capsule, Take 20 mg by mouth daily., Disp: , Rfl:    Sodium Oxybate (XYREM PO), Take by mouth., Disp: , Rfl:    tamsulosin (FLOMAX) 0.4 MG CAPS capsule, Take 1 capsule (0.4 mg total) by mouth daily., Disp: 30 capsule, Rfl: 1   No Known Allergies  Past Medical History:  Diagnosis Date   Adult ADHD    Connective tissue stenosis of neural canal of lumbar region 03/17/2014   Gastroesophageal reflux disease without esophagitis 01/27/2016   GERD (gastroesophageal reflux disease)    Hypothyroidism 03/04/2016   Low iron    Muscle cramps    Restless leg      Past Surgical History:  Procedure Laterality Date   GIVENS CAPSULE STUDY N/A 01/13/2020   Procedure: GIVENS CAPSULE STUDY;  Surgeon: Charna Elizabeth, MD;  Location: [facility] ENDOSCOPY;  Service: Endoscopy;  Laterality: N/A;    Family History  Family history unknown: Yes    Social History   Tobacco Use   Smoking status: Never   Smokeless tobacco: Never  Vaping Use   Vaping Use: Never used  Substance Use Topics   Alcohol use: Not Currently   Drug use: Not Currently    ROS   Objective:   Vitals: BP (!) 149/82 (BP Location: Right Arm)   Pulse 75   Temp 97.9 F (36.6 C) (Oral)   Resp 16   SpO2 98%   Physical Exam Constitutional:  General: He is not in acute distress.    Appearance: Normal appearance. He is well-developed and normal weight. He is not ill-appearing, toxic-appearing or diaphoretic.  HENT:     Head: Normocephalic and atraumatic.     Right Ear: External ear normal.     Left Ear: External ear normal.     Nose: Nose normal.     Mouth/Throat:     Pharynx: Oropharynx is clear.  Eyes:     General: No scleral icterus.       Right eye: No discharge.        Left eye: No discharge.     Extraocular Movements: Extraocular movements intact.  Cardiovascular:     Rate and Rhythm: Normal rate.  Pulmonary:     Effort: Pulmonary effort is normal.  Musculoskeletal:     Cervical back: Normal range of motion.   Skin:    Findings: Rash (patches of annular lesions with slight hyperpigmentation centrally scattered over the thigh left side, back to a lesser degree and left upper arm) present.  Neurological:     Mental Status: He is alert and oriented to person, place, and time.  Psychiatric:        Mood and Affect: Mood normal.        Behavior: Behavior normal.        Thought Content: Thought content normal.        Judgment: Judgment normal.    Assessment and Plan :   PDMP not reviewed this encounter.  1. Tinea corporis     Will cover for tinea corporis with oral fluconazole.  Counseled patient on potential for adverse effects with medications prescribed/recommended today, ER and return-to-clinic precautions discussed, patient verbalized understanding.    Wallis Bamberg, PA-C 08/13/22 1236

## 2022-08-19 ENCOUNTER — Ambulatory Visit: Payer: BC Managed Care – PPO | Admitting: Family Medicine

## 2022-08-24 ENCOUNTER — Encounter: Payer: Self-pay | Admitting: Family Medicine

## 2022-08-24 ENCOUNTER — Ambulatory Visit: Payer: BC Managed Care – PPO | Admitting: Family Medicine

## 2022-08-24 VITALS — BP 136/84 | HR 96 | Ht 72.0 in | Wt 240.0 lb

## 2022-08-24 DIAGNOSIS — G2581 Restless legs syndrome: Secondary | ICD-10-CM

## 2022-08-24 DIAGNOSIS — Z5181 Encounter for therapeutic drug level monitoring: Secondary | ICD-10-CM

## 2022-08-24 DIAGNOSIS — G4733 Obstructive sleep apnea (adult) (pediatric): Secondary | ICD-10-CM

## 2022-08-24 DIAGNOSIS — K219 Gastro-esophageal reflux disease without esophagitis: Secondary | ICD-10-CM

## 2022-08-24 DIAGNOSIS — E039 Hypothyroidism, unspecified: Secondary | ICD-10-CM | POA: Diagnosis not present

## 2022-08-24 DIAGNOSIS — F909 Attention-deficit hyperactivity disorder, unspecified type: Secondary | ICD-10-CM | POA: Diagnosis not present

## 2022-08-24 DIAGNOSIS — M791 Myalgia, unspecified site: Secondary | ICD-10-CM

## 2022-08-24 DIAGNOSIS — R7989 Other specified abnormal findings of blood chemistry: Secondary | ICD-10-CM

## 2022-08-24 DIAGNOSIS — Z13220 Encounter for screening for lipoid disorders: Secondary | ICD-10-CM

## 2022-08-24 DIAGNOSIS — M9943 Connective tissue stenosis of neural canal of lumbar region: Secondary | ICD-10-CM | POA: Diagnosis not present

## 2022-08-24 DIAGNOSIS — R739 Hyperglycemia, unspecified: Secondary | ICD-10-CM

## 2022-08-24 DIAGNOSIS — E038 Other specified hypothyroidism: Secondary | ICD-10-CM

## 2022-08-24 DIAGNOSIS — R252 Cramp and spasm: Secondary | ICD-10-CM

## 2022-08-24 NOTE — Progress Notes (Signed)
I, Stevenson Clinch, CMA acting as a scribe for Jay Graham, MD.  Jay Schneider is a 57 y.o. male who presents to [facility] today for cont'd leg cramping. Pt was last seen by Dr. [name] on 03/29/22 for this issue and metabolic and rheumatologic labs were obtained and he was evaluated for spinal stenosis. He was also referred to [facility] endocrinology.   Today, pt reports completing eval with [name], parathyroid is a little high but doesn't seem to be impacting anything else. They did not think that it was related to the leg cramping. Had normal DEXA. Denies new or worsening sx since last visit.   He notes continued cramping and leg weakness.  His PCP wanted to get fasting labs.  However his PCP offices in [place] and that is inconvenient.  He wonders if those labs could be done at this office in the near future.  Dx testing: 03/29/22 [facility]   Pertinent review of systems: No fevers or chills  Relevant historical information: Sleep apnea and narcolepsy..   Exam:  BP 136/84   Pulse 96   Ht 6' (1.829 m)   Wt 240 lb (108.9 kg)   SpO2 97%   BMI 32.55 kg/m  General: Well Developed, well nourished, and in no acute distress.   MSK: Normal appearing legs      Assessment and Plan: 57 y.o. male with bilateral lower extremity cramping and leg weakness.  Etiology remains unclear.  He had extensive workup in the past.  The only thing left would be a rheumatologic workup which we can accomplish in the near future.  If that is normal there is a good thing to do would be a muscle biopsy which is a bit extreme but may be necessary.  I saw the list of the labs that his primary care provider wanted.  Will do those labs as well fasting and send to PCP.   PDMP not reviewed this encounter. Orders Placed This Encounter  Procedures   CBC with Differential/Platelet    Standing Status:   Future    Standing Expiration Date:   02/23/2023    Comprehensive metabolic panel    Standing Status:   Future    Standing Expiration Date:   08/24/2023   Hemoglobin A1c    Standing Status:   Future    Standing Expiration Date:   08/24/2023   Lipid panel    Standing Status:   Future    Standing Expiration Date:   08/24/2023   Magnesium    Standing Status:   Future    Standing Expiration Date:   08/24/2023   TSH    Standing Status:   Future    Standing Expiration Date:   08/24/2023   B12 and Folate Panel    Standing Status:   Future    Standing Expiration Date:   08/24/2023   Sedimentation rate    Standing Status:   Future    Standing Expiration Date:   08/24/2023   ANA    Standing Status:   Future    Standing Expiration Date:   08/24/2023   CK    Standing Status:   Future    Standing Expiration Date:   08/24/2023   Cyclic citrul peptide antibody, IgG    Standing Status:   Future    Standing Expiration Date:   08/24/2023   Rheumatoid factor    Standing Status:   Future    Standing Expiration  Date:   08/24/2023   B. burgdorfi antibodies   No orders of the defined types were placed in this encounter.    Discussed warning signs or symptoms. Please see discharge instructions. Patient expresses understanding.   The above documentation has been reviewed and is accurate and complete Jay Schneider, M.D.

## 2022-08-24 NOTE — Patient Instructions (Addendum)
Thank you for coming in today.   Please get labs in the future fasting.

## 2022-08-29 LAB — TSH: TSH: 6.43 u[IU]/mL — ABNORMAL HIGH (ref 0.35–5.50)

## 2022-08-29 LAB — SEDIMENTATION RATE: Sed Rate: 5 mm/hr (ref 0–20)

## 2022-08-29 LAB — CBC WITH DIFFERENTIAL/PLATELET
Basophils Absolute: 0 10*3/uL (ref 0.0–0.1)
Basophils Relative: 1.1 % (ref 0.0–3.0)
Eosinophils Absolute: 0.2 10*3/uL (ref 0.0–0.7)
Eosinophils Relative: 4.4 % (ref 0.0–5.0)
HCT: 42.9 % (ref 39.0–52.0)
Hemoglobin: 14.4 g/dL (ref 13.0–17.0)
Lymphocytes Relative: 27.3 % (ref 12.0–46.0)
Lymphs Abs: 1.1 10*3/uL (ref 0.7–4.0)
MCHC: 33.5 g/dL (ref 30.0–36.0)
MCV: 85 fl (ref 78.0–100.0)
Monocytes Absolute: 0.3 10*3/uL (ref 0.1–1.0)
Monocytes Relative: 8.3 % (ref 3.0–12.0)
Neutro Abs: 2.4 10*3/uL (ref 1.4–7.7)
Neutrophils Relative %: 58.9 % (ref 43.0–77.0)
Platelets: 141 10*3/uL — ABNORMAL LOW (ref 150.0–400.0)
RBC: 5.05 Mil/uL (ref 4.22–5.81)
RDW: 12.8 % (ref 11.5–15.5)
WBC: 4.1 10*3/uL (ref 4.0–10.5)

## 2022-08-29 LAB — COMPREHENSIVE METABOLIC PANEL
ALT: 49 U/L (ref 0–53)
AST: 30 U/L (ref 0–37)
Albumin: 4.6 g/dL (ref 3.5–5.2)
Alkaline Phosphatase: 53 U/L (ref 39–117)
BUN: 12 mg/dL (ref 6–23)
CO2: 30 mEq/L (ref 19–32)
Calcium: 9.9 mg/dL (ref 8.4–10.5)
Chloride: 103 mEq/L (ref 96–112)
Creatinine, Ser: 1.06 mg/dL (ref 0.40–1.50)
GFR: 78.88 mL/min (ref >60.00)
Glucose, Bld: 112 mg/dL — ABNORMAL HIGH (ref 70–99)
Potassium: 4.4 mEq/L (ref 3.5–5.1)
Sodium: 142 mEq/L (ref 135–145)
Total Bilirubin: 0.6 mg/dL (ref 0.2–1.2)
Total Protein: 7.1 g/dL (ref 6.0–8.3)

## 2022-08-29 LAB — CK: Total CK: 222 U/L (ref 7–232)

## 2022-08-29 LAB — MAGNESIUM: Magnesium: 2.1 mg/dL (ref 1.5–2.5)

## 2022-08-29 LAB — B12 AND FOLATE PANEL
Folate: 14.6 ng/mL (ref >5.9)
Vitamin B-12: 460 pg/mL (ref 211–911)

## 2022-08-30 LAB — LIPID PANEL
Cholesterol: 240 mg/dL — ABNORMAL HIGH (ref <200)
HDL: 37 mg/dL — ABNORMAL LOW (ref >=40)
LDL Cholesterol (Calc): 161 mg/dL (calc) — ABNORMAL HIGH
Non-HDL Cholesterol (Calc): 203 mg/dL (calc) — ABNORMAL HIGH (ref <130)
Total CHOL/HDL Ratio: 6.5 (calc) — ABNORMAL HIGH (ref <5.0)
Triglycerides: 258 mg/dL — ABNORMAL HIGH (ref <150)

## 2022-08-30 LAB — HEMOGLOBIN A1C
Hgb A1c MFr Bld: 6 % of total Hgb — ABNORMAL HIGH (ref <5.7)
Mean Plasma Glucose: 126 mg/dL
eAG (mmol/L): 7 mmol/L

## 2022-08-30 LAB — B. BURGDORFI ANTIBODIES: B burgdorferi Ab IgG+IgM: <0.9 index

## 2022-08-31 ENCOUNTER — Encounter: Payer: Self-pay | Admitting: Family Medicine

## 2022-08-31 NOTE — Progress Notes (Signed)
All labs are now back.  I mailed a copy of the labs to your primary care provider.  Your cholesterol is a bit elevated and your A1c is elevated which looks at blood sugar.  You do not have diabetes but do have prediabetes.  TSH is elevated indicating that your thyroid hormone level may be a bit low. Lyme disease was negative muscle inflammation lab CK was negative sedimentation rate lab for inflammation was normal.  B12 level is normal.

## 2022-09-01 ENCOUNTER — Encounter: Payer: Self-pay | Admitting: Family Medicine

## 2022-09-01 DIAGNOSIS — R252 Cramp and spasm: Secondary | ICD-10-CM

## 2022-09-01 LAB — RHEUMATOID FACTOR: Rheumatoid fact SerPl-aCnc: <10 IU/mL (ref <14)

## 2022-09-01 LAB — CYCLIC CITRUL PEPTIDE ANTIBODY, IGG: Cyclic Citrullin Peptide Ab: <16 UNITS

## 2022-09-01 LAB — ANA: Anti Nuclear Antibody (ANA): NEGATIVE

## 2022-09-01 NOTE — Progress Notes (Signed)
Rheumatology labs are normal

## 2022-09-02 NOTE — Telephone Encounter (Signed)
Forwarding to Dr. Corey to review and advise.  

## 2022-09-27 NOTE — Telephone Encounter (Signed)
Forwarding to Dr. Corey to advise.  

## 2022-09-27 NOTE — Telephone Encounter (Signed)
Per visit note 08/24/22:  Assessment and Plan: 57 y.o. male with bilateral lower extremity cramping and leg weakness.  Etiology remains unclear.  He had extensive workup in the past.  The only thing left would be a rheumatologic workup which we can accomplish in the near future.  If that is normal there is a good thing to do would be a muscle biopsy which is a bit extreme but may be necessary.   I saw the list of the labs that his primary care provider wanted.  Will do those labs as well fasting and send to PCP.

## 2022-10-10 NOTE — Addendum Note (Signed)
Addended by: Rodolph Bong on: 10/10/2022 09:33 AM   Modules accepted: Orders

## 2022-10-19 ENCOUNTER — Ambulatory Visit
Admission: RE | Admit: 2022-10-19 | Discharge: 2022-10-19 | Disposition: A | Discharge status: Home / Self Care | Payer: BC Managed Care – PPO | Source: Ambulatory Visit | Attending: Family Medicine | Admitting: Family Medicine

## 2022-10-19 DIAGNOSIS — R252 Cramp and spasm: Secondary | ICD-10-CM

## 2022-10-24 MED ORDER — BACLOFEN 20 MG PO TABS: 10.0000 mg | ORAL_TABLET | Freq: Every evening | ORAL | 3 refills | Status: AC | PRN

## 2022-10-24 NOTE — Addendum Note (Signed)
Addended by: Rodolph Bong on: 10/24/2022 12:38 PM   Modules accepted: Orders

## 2022-10-28 ENCOUNTER — Telehealth: Payer: Self-pay | Admitting: Family Medicine

## 2022-10-28 DIAGNOSIS — R252 Cramp and spasm: Secondary | ICD-10-CM

## 2022-10-28 NOTE — Telephone Encounter (Signed)
Epidural steroid injection or

## 2022-10-28 NOTE — Progress Notes (Signed)
You have several areas where nerves in your back could be pinched causing potentially the symptoms you are experiencing.  It is not certain if what you are feeling is due to the nerves in your back but it certainly could be.  I have ordered an epidural steroid injection to be done by the radiologist which has a pretty good chance of working.  I am optimistic about this.  You should hear from the radiology office soon about scheduling.  Their direct phone number is [phone] if you would like to call and schedule yourself.

## 2022-11-17 ENCOUNTER — Other Ambulatory Visit: Payer: Self-pay | Admitting: *Deleted

## 2022-11-17 ENCOUNTER — Encounter: Payer: Self-pay | Admitting: Urology

## 2022-11-17 MED ORDER — TAMSULOSIN HCL 0.4 MG PO CAPS: 0.4000 mg | ORAL_CAPSULE | Freq: Every day | ORAL | 9 refills | Status: DC

## 2022-12-28 ENCOUNTER — Other Ambulatory Visit (HOSPITAL_COMMUNITY): Payer: Self-pay

## 2023-02-13 ENCOUNTER — Telehealth: Payer: Self-pay | Admitting: Family Medicine

## 2023-02-13 NOTE — Telephone Encounter (Signed)
Cj reached out informing me he had an EMG showing cramp fasciculation syndrome.  I looked it up. It is not a diagnosis I am familiar with and will ask him some more questions.

## 2023-08-30 ENCOUNTER — Ambulatory Visit: Payer: Self-pay | Admitting: Urology

## 2023-08-30 VITALS — BP 149/85 | HR 79 | Ht 72.0 in | Wt 235.0 lb

## 2023-08-30 DIAGNOSIS — Z125 Encounter for screening for malignant neoplasm of prostate: Secondary | ICD-10-CM

## 2023-08-30 DIAGNOSIS — N5312 Painful ejaculation: Secondary | ICD-10-CM

## 2023-08-30 DIAGNOSIS — R399 Unspecified symptoms and signs involving the genitourinary system: Secondary | ICD-10-CM | POA: Diagnosis not present

## 2023-08-30 MED ORDER — TAMSULOSIN HCL 0.4 MG PO CAPS: 0.4000 mg | ORAL_CAPSULE | Freq: Every day | ORAL | 11 refills | Status: AC

## 2023-08-30 NOTE — Progress Notes (Signed)
 I, Jay Schneider, acting as a scribe for Jay Knapp, MD., have documented all relevant documentation on the behalf of Jay Knapp, MD, as directed by Jay Knapp, MD while in the presence of Jay Knapp, MD.  08/30/2023 7:24 PM   [name] [birth year] [id number]  Referring provider: Rory Collard, MD [phone] [place],  [place] [address]  Chief Complaint  Patient presents with   Other    Painful ejaculation     HPI: Jay Schneider is a 58 y.o. male presents for annual follow-up.   Refer to prior visit 08/11/2022,  He noted significant improvement in his symptoms with starting tamsulosin  His ejaculatory pain resolved. He also noted significant improvement in his voiding pattern with significant decrease in nocturia, decreased urinary frequency.    PMH: Past Medical History:  Diagnosis Date   Adult ADHD    Connective tissue stenosis of neural canal of lumbar region 03/17/2014   Gastroesophageal reflux disease without esophagitis 01/27/2016   GERD (gastroesophageal reflux disease)    Hypothyroidism 03/04/2016   Low iron    Muscle cramps    Restless leg     Surgical History: Past Surgical History:  Procedure Laterality Date   GIVENS CAPSULE STUDY N/A 01/13/2020   Procedure: GIVENS CAPSULE STUDY;  Surgeon: Tami Falcon, MD;  Location: [facility] ENDOSCOPY;  Service: Endoscopy;  Laterality: N/A;    Home Medications:  Allergies as of 08/30/2023   No Known Allergies      Medication List        Accurate as of August 30, 2023  7:24 PM. If you have any questions, ask your nurse or doctor.          STOP taking these medications    AMBULATORY NON FORMULARY MEDICATION   buPROPion  150 MG 24 hr tablet Commonly known as: WELLBUTRIN  XL   fluconazole  150 MG tablet Commonly known as: DIFLUCAN    ibuprofen  800 MG tablet Commonly known as: ADVIL    lisdexamfetamine 30 MG capsule Commonly known as: Vyvanse    XYREM  PO       TAKE these medications    baclofen  20 MG tablet Commonly known as: LIORESAL  Take 0.5-1 tablets (10-20 mg total) by mouth at bedtime as needed for muscle spasms.   ferrous sulfate 325 (65 FE) MG tablet Take 325 mg by mouth daily with breakfast.   hydrocortisone cream 1 % Apply 1 application topically 2 (two) times daily as needed (irritation/pain.).   levothyroxine 100 MCG tablet Commonly known as: SYNTHROID Take 100 mcg by mouth daily before breakfast.   omeprazole 20 MG capsule Commonly known as: PRILOSEC Take 20 mg by mouth daily.   tamsulosin  0.4 MG Caps capsule Commonly known as: FLOMAX  Take 1 capsule (0.4 mg total) by mouth daily.   VITAMIN B-12 PO Take 1 tablet by mouth daily.   VITAMIN D3 PO Take 1 tablet by mouth daily.        Allergies: No Known Allergies  Family History: Family History  Family history unknown: Yes    Social History:  reports that he has never smoked. He has never used smokeless tobacco. He reports that he does not currently use alcohol. He reports that he does not currently use drugs.   Physical Exam: BP (!) 149/85   Pulse 79   Ht 6' (1.829 m)   Wt 235 lb (106.6 kg)   BMI 31.87 kg/m   Constitutional:  Alert and oriented, No acute distress.  HEENT: [place] AT Respiratory: Normal respiratory effort, no increased work of breathing. Psychiatric: Normal mood and affect.   Assessment & Plan:    1. Ejaculatory pain Resolved on tamsulosin   2. Lower urinary tract symptoms Significant improvement in his lower urinary tract symptoms.  3. Prostate cancer screening His last PSA was October 2023. He states he is scheduled for a follow-up with his PCP in the near future, and his PSA will be drawn at that time.   If Dr. Vila Grayer will assume  refilling tamsulosin , I can see as needed. Otherwise, he will continue annual follow-up for refills.  I have reviewed the above documentation for accuracy and completeness, and I agree with  the above.   Jay Knapp, MD  [facility] [address] [place], [place] [address] [phone]

## 2023-08-31 ENCOUNTER — Ambulatory Visit: Payer: BC Managed Care – PPO | Admitting: Urology

## 2023-09-01 ENCOUNTER — Encounter: Payer: Self-pay | Admitting: Urology

## 2023-12-04 ENCOUNTER — Ambulatory Visit (HOSPITAL_COMMUNITY)
Admission: RE | Admit: 2023-12-04 | Discharge: 2023-12-04 | Disposition: A | Discharge status: Home / Self Care | Source: Ambulatory Visit | Attending: Family Medicine | Admitting: Family Medicine

## 2023-12-04 ENCOUNTER — Encounter (HOSPITAL_COMMUNITY): Payer: Self-pay

## 2023-12-04 VITALS — BP 155/89 | HR 59 | Temp 97.8°F | Resp 16

## 2023-12-04 DIAGNOSIS — B354 Tinea corporis: Secondary | ICD-10-CM

## 2023-12-04 MED ORDER — KETOCONAZOLE 2 % EX CREA: 1.0000 | TOPICAL_CREAM | Freq: Every day | CUTANEOUS | 0 refills | Status: AC

## 2023-12-04 NOTE — ED Triage Notes (Signed)
 Pt states he has a couple of spots that are a rash, one spot on back another on right side X 2 days. He had one other spot he used antifungal on and it help but not sure its helping these other spots.

## 2023-12-04 NOTE — ED Provider Notes (Signed)
 [facility]    CSN: [id number] Arrival date & time: 12/04/23  1857      History   Chief Complaint Chief Complaint  Patient presents with   Rash    Rash in a few rings - Entered by patient    HPI Jay Schneider is a 58 y.o. male.    Rash  Has a rash on his right chest and the posterior axillary line and also some on his right posterior shoulder.  This been a few weeks and is maybe itchy sometimes but not much.  He been spraying an antifungal Tinactin on it and it helped a similar rash on his left side but has not made the right 1 go away.  NKDA  Past Medical History:  Diagnosis Date   Adult ADHD    Connective tissue stenosis of neural canal of lumbar region 03/17/2014   Gastroesophageal reflux disease without esophagitis 01/27/2016   GERD (gastroesophageal reflux disease)    Hypothyroidism 03/04/2016   Low iron    Muscle cramps    Restless leg     Patient Active Problem List   Diagnosis Date Noted   Restless leg syndrome 08/14/2019   Moderate episode of recurrent major depressive disorder (HCC) 12/25/2017   Sleep apnea 12/04/2017   Attention deficit hyperactivity disorder (ADHD) 12/04/2017   Social anxiety disorder 12/04/2017   Synovitis of hand 12/16/2016   Hypothyroidism 03/04/2016   Leg cramping 03/04/2016   Gastroesophageal reflux disease without esophagitis 01/27/2016   Connective tissue stenosis of neural canal of lumbar region 03/17/2014    Past Surgical History:  Procedure Laterality Date   GIVENS CAPSULE STUDY N/A 01/13/2020   Procedure: GIVENS CAPSULE STUDY;  Surgeon: Kristie Lamprey, MD;  Location: [facility] ENDOSCOPY;  Service: Endoscopy;  Laterality: N/A;       Home Medications    Prior to Admission medications   Medication Sig Start Date End Date Taking? Authorizing Provider  Amphetamine  Sulfate 10 MG TABS Take 2 tablets by mouth every morning.   Yes [provider]  baclofen  (LIORESAL ) 20 MG tablet Take 0.5-1  tablets (10-20 mg total) by mouth at bedtime as needed for muscle spasms. 10/24/22  Yes Corey, Evan S, MD  Cholecalciferol (VITAMIN D3 PO) Take 1 tablet by mouth daily.   Yes [provider]  Cyanocobalamin  (VITAMIN B-12 PO) Take 1 tablet by mouth daily.   Yes [provider]  ferrous sulfate 325 (65 FE) MG tablet Take 325 mg by mouth daily with breakfast.   Yes [provider]  ketoconazole  (NIZORAL ) 2 % cream Apply 1 Application topically daily. To affected area till better 12/04/23  Yes Rishon Thilges K, MD  levothyroxine (SYNTHROID) 100 MCG tablet Take 100 mcg by mouth daily before breakfast.  08/12/19  Yes [provider]  magnesium oxide (MAG-OX) 400 MG tablet Take 400 mg by mouth.   Yes [provider]  omeprazole (PRILOSEC) 20 MG capsule Take 20 mg by mouth daily.   Yes [provider]  tamsulosin  (FLOMAX ) 0.4 MG CAPS capsule Take 1 capsule (0.4 mg total) by mouth daily. 08/30/23  Yes Stoioff, Scott C, MD  hydrocortisone cream 1 % Apply 1 application topically 2 (two) times daily as needed (irritation/pain.).    [provider]    Family History Family History  Family history unknown: Yes    Social History Social History   Tobacco Use   Smoking status: Never   Smokeless tobacco: Never  Vaping Use  Vaping status: Never Used  Substance Use Topics   Alcohol use: Not Currently   Drug use: Not Currently     Allergies   Patient has no known allergies.   Review of Systems Review of Systems  Skin:  Positive for rash.     Physical Exam Triage Vital Signs ED Triage Vitals  Encounter Vitals Group     BP 12/04/23 1917 (!) 155/89     Girls Systolic BP Percentile --      Girls Diastolic BP Percentile --      Boys Systolic BP Percentile --      Boys Diastolic BP Percentile --      Pulse Rate 12/04/23 1917 (!) 59     Resp 12/04/23 1917 16     Temp 12/04/23 1917 97.8 F (36.6 C)     Temp Source 12/04/23 1917  Oral     SpO2 12/04/23 1917 96 %     Weight --      Height --      Head Circumference --      Peak Flow --      Pain Score 12/04/23 1915 0     Pain Loc --      Pain Education --      Exclude from Growth Chart --    No data found.  Updated Vital Signs BP (!) 155/89 (BP Location: Right Arm)   Pulse (!) 59   Temp 97.8 F (36.6 C) (Oral)   Resp 16   SpO2 96%   Visual Acuity Right Eye Distance:   Left Eye Distance:   Bilateral Distance:    Right Eye Near:   Left Eye Near:    Bilateral Near:     Physical Exam Vitals reviewed.  Constitutional:      General: He is not in acute distress.    Appearance: He is not ill-appearing, toxic-appearing or diaphoretic.  Skin:    Coloration: Skin is not jaundiced or pale.     Comments: There is a circular mildly erythematous rash this bleeding edges that is about 2 x 1 cm in his right axilla.  There is a similar smaller one that is fainter on the right posterior shoulder.  Neurological:     Mental Status: He is alert.      UC Treatments / Results  Labs (all labs ordered are listed, but only abnormal results are displayed) Labs Reviewed - No data to display  EKG   Radiology No results found.  Procedures Procedures (including critical care time)  Medications Ordered in UC Medications - No data to display  Initial Impression / Assessment and Plan / UC Course  I have reviewed the triage vital signs and the nursing notes.  Pertinent labs & imaging results that were available during my care of the patient were reviewed by me and considered in my medical decision making (see chart for details).      Ketoconazole  cream was sent in to treat for possible tinea corporis.  He will follow-up with his primary care Final Clinical Impressions(s) / UC Diagnoses   Final diagnoses:  Tinea corporis     Discharge Instructions      Ketoconazole  cream--apply once daily to the red area till better.  Please follow-up with your  primary care, especially if this is not improving    ED Prescriptions     Medication Sig Dispense Auth. Provider   ketoconazole  (NIZORAL ) 2 % cream Apply 1 Application topically daily. To affected area till better  30 g Vonna Sharlet POUR, MD      PDMP not reviewed this encounter.   Vonna Sharlet POUR, MD 12/04/23 2049

## 2023-12-04 NOTE — Discharge Instructions (Signed)
 Ketoconazole  cream--apply once daily to the red area till better.  Please follow-up with your primary care, especially if this is not improving

## 2024-08-29 ENCOUNTER — Ambulatory Visit: Admitting: Urology
